# Patient Record
Sex: Female | Born: 1990 | Hispanic: Yes | Marital: Single | Smoking: Former smoker
Health system: Southern US, Community
[De-identification: ages and names within clinical notes are randomized; demographics above are authoritative.]

## PROBLEM LIST (undated history)

## (undated) ENCOUNTER — Inpatient Hospital Stay (HOSPITAL_COMMUNITY): Payer: Self-pay

## (undated) DIAGNOSIS — I82629 Acute embolism and thrombosis of deep veins of unspecified upper extremity: Secondary | ICD-10-CM

## (undated) DIAGNOSIS — O009 Unspecified ectopic pregnancy without intrauterine pregnancy: Secondary | ICD-10-CM

## (undated) DIAGNOSIS — N739 Female pelvic inflammatory disease, unspecified: Secondary | ICD-10-CM

## (undated) DIAGNOSIS — R011 Cardiac murmur, unspecified: Secondary | ICD-10-CM

## (undated) DIAGNOSIS — J45909 Unspecified asthma, uncomplicated: Secondary | ICD-10-CM

## (undated) HISTORY — PX: WISDOM TOOTH EXTRACTION: SHX21

---
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
## 2009-04-07 DIAGNOSIS — I82629 Acute embolism and thrombosis of deep veins of unspecified upper extremity: Secondary | ICD-10-CM

## 2009-04-07 HISTORY — DX: Acute embolism and thrombosis of deep veins of unspecified upper extremity: I82.629

## 2010-02-12 ENCOUNTER — Emergency Department (HOSPITAL_COMMUNITY): Admission: EM | Admit: 2010-02-12 | Discharge: 2010-02-12 | Payer: Self-pay | Admitting: Emergency Medicine

## 2010-02-12 ENCOUNTER — Encounter (INDEPENDENT_AMBULATORY_CARE_PROVIDER_SITE_OTHER): Payer: Self-pay | Admitting: Emergency Medicine

## 2010-06-18 LAB — URINE CULTURE: Culture  Setup Time: 201111082027

## 2010-06-18 LAB — DIFFERENTIAL
Basophils Relative: 0 % (ref 0–1)
Eosinophils Absolute: 0.3 10*3/uL (ref 0.0–0.7)
Eosinophils Relative: 2 % (ref 0–5)
Lymphs Abs: 3.3 10*3/uL (ref 0.7–4.0)
Monocytes Relative: 11 % (ref 3–12)

## 2010-06-18 LAB — CBC
Hemoglobin: 12.5 g/dL (ref 12.0–15.0)
MCH: 29.8 pg (ref 26.0–34.0)
MCHC: 33.8 g/dL (ref 30.0–36.0)
MCV: 88.1 fL (ref 78.0–100.0)
Platelets: 337 10*3/uL (ref 150–400)
RBC: 4.2 MIL/uL (ref 3.87–5.11)

## 2010-06-18 LAB — POCT I-STAT, CHEM 8
BUN: 4 mg/dL — ABNORMAL LOW (ref 6–23)
Calcium, Ion: 1.09 mmol/L — ABNORMAL LOW (ref 1.12–1.32)
Creatinine, Ser: 0.7 mg/dL (ref 0.4–1.2)
TCO2: 24 mmol/L (ref 0–100)

## 2010-06-18 LAB — URINALYSIS, ROUTINE W REFLEX MICROSCOPIC
Bilirubin Urine: NEGATIVE
Glucose, UA: NEGATIVE mg/dL
Hgb urine dipstick: NEGATIVE
Protein, ur: NEGATIVE mg/dL
Urobilinogen, UA: 0.2 mg/dL (ref 0.0–1.0)

## 2010-06-18 LAB — URINE MICROSCOPIC-ADD ON

## 2010-06-22 ENCOUNTER — Emergency Department (HOSPITAL_COMMUNITY)
Admission: EM | Admit: 2010-06-22 | Discharge: 2010-06-22 | Disposition: A | Discharge status: Home / Self Care | Payer: Medicaid Other | Attending: Emergency Medicine | Admitting: Emergency Medicine

## 2010-06-22 DIAGNOSIS — R064 Hyperventilation: Secondary | ICD-10-CM | POA: Insufficient documentation

## 2010-06-22 DIAGNOSIS — F319 Bipolar disorder, unspecified: Secondary | ICD-10-CM | POA: Insufficient documentation

## 2010-06-22 DIAGNOSIS — R55 Syncope and collapse: Secondary | ICD-10-CM | POA: Insufficient documentation

## 2010-06-22 DIAGNOSIS — F1010 Alcohol abuse, uncomplicated: Secondary | ICD-10-CM | POA: Insufficient documentation

## 2010-06-22 LAB — RAPID URINE DRUG SCREEN, HOSP PERFORMED
Barbiturates: NOT DETECTED
Opiates: POSITIVE — AB
Tetrahydrocannabinol: NOT DETECTED

## 2010-06-22 LAB — CBC
HCT: 37.3 % (ref 36.0–46.0)
MCHC: 34.6 g/dL (ref 30.0–36.0)
RDW: 13.4 % (ref 11.5–15.5)

## 2010-06-22 LAB — DIFFERENTIAL
Basophils Absolute: 0.1 10*3/uL (ref 0.0–0.1)
Basophils Relative: 1 % (ref 0–1)
Eosinophils Absolute: 0.2 10*3/uL (ref 0.0–0.7)
Eosinophils Relative: 2 % (ref 0–5)
Monocytes Absolute: 1 10*3/uL (ref 0.1–1.0)

## 2010-06-22 LAB — POCT I-STAT, CHEM 8
Calcium, Ion: 1.14 mmol/L (ref 1.12–1.32)
Creatinine, Ser: 1 mg/dL (ref 0.4–1.2)
Glucose, Bld: 99 mg/dL (ref 70–99)
Hemoglobin: 13.6 g/dL (ref 12.0–15.0)
TCO2: 23 mmol/L (ref 0–100)

## 2010-06-24 LAB — GLUCOSE, CAPILLARY

## 2010-07-03 ENCOUNTER — Emergency Department (HOSPITAL_COMMUNITY)
Admission: EM | Admit: 2010-07-03 | Discharge: 2010-07-03 | Disposition: A | Discharge status: Home / Self Care | Payer: Medicaid Other | Attending: Emergency Medicine | Admitting: Emergency Medicine

## 2010-07-03 DIAGNOSIS — K2970 Gastritis, unspecified, without bleeding: Secondary | ICD-10-CM | POA: Insufficient documentation

## 2010-07-03 DIAGNOSIS — K2990 Gastroduodenitis, unspecified, without bleeding: Secondary | ICD-10-CM | POA: Insufficient documentation

## 2010-07-03 DIAGNOSIS — R10816 Epigastric abdominal tenderness: Secondary | ICD-10-CM | POA: Insufficient documentation

## 2010-07-03 LAB — DIFFERENTIAL
Basophils Absolute: 0 10*3/uL (ref 0.0–0.1)
Basophils Relative: 0 % (ref 0–1)
Eosinophils Absolute: 0.2 10*3/uL (ref 0.0–0.7)
Eosinophils Relative: 2 % (ref 0–5)
Monocytes Absolute: 1 10*3/uL (ref 0.1–1.0)
Neutro Abs: 7.5 10*3/uL (ref 1.7–7.7)

## 2010-07-03 LAB — URINALYSIS, ROUTINE W REFLEX MICROSCOPIC
Bilirubin Urine: NEGATIVE
Nitrite: NEGATIVE
Specific Gravity, Urine: 1.023 (ref 1.005–1.030)
Urobilinogen, UA: 0.2 mg/dL (ref 0.0–1.0)

## 2010-07-03 LAB — COMPREHENSIVE METABOLIC PANEL
ALT: 11 U/L (ref 0–35)
AST: 13 U/L (ref 0–37)
Calcium: 8 mg/dL — ABNORMAL LOW (ref 8.4–10.5)
GFR calc Af Amer: >60 mL/min (ref >60)
Sodium: 136 mEq/L (ref 135–145)
Total Protein: 6.2 g/dL (ref 6.0–8.3)

## 2010-07-03 LAB — CBC
MCHC: 32.6 g/dL (ref 30.0–36.0)
Platelets: 304 10*3/uL (ref 150–400)
RDW: 13.4 % (ref 11.5–15.5)

## 2010-07-03 LAB — PREGNANCY, URINE: Preg Test, Ur: NEGATIVE

## 2010-07-12 LAB — CBC
HCT: 36.3 % (ref 36.0–46.0)
HCT: 37.6 % (ref 36.0–46.0)
Hemoglobin: 12.2 g/dL (ref 12.0–15.0)
Hemoglobin: 12.9 g/dL (ref 12.0–15.0)
MCHC: 34.4 g/dL (ref 30.0–36.0)
MCV: 89.1 fL (ref 78.0–100.0)
Platelets: 287 10*3/uL (ref 150–400)
RDW: 12.3 % (ref 11.5–15.5)
RDW: 12.6 % (ref 11.5–15.5)
WBC: 10 10*3/uL (ref 4.0–10.5)

## 2010-07-12 LAB — URINALYSIS, ROUTINE W REFLEX MICROSCOPIC
Bilirubin Urine: NEGATIVE
Glucose, UA: NEGATIVE mg/dL
Glucose, UA: NEGATIVE mg/dL
Hgb urine dipstick: NEGATIVE
Ketones, ur: 15 mg/dL — AB
Ketones, ur: NEGATIVE mg/dL
Leukocytes, UA: NEGATIVE
Protein, ur: NEGATIVE mg/dL
Protein, ur: NEGATIVE mg/dL
pH: 7 (ref 5.0–8.0)

## 2010-07-12 LAB — DIFFERENTIAL
Basophils Absolute: 0 10*3/uL (ref 0.0–0.1)
Basophils Absolute: 0 10*3/uL (ref 0.0–0.1)
Basophils Relative: 0 % (ref 0–1)
Basophils Relative: 1 % (ref 0–1)
Eosinophils Absolute: 0.1 10*3/uL (ref 0.0–0.7)
Eosinophils Relative: 2 % (ref 0–5)
Monocytes Absolute: 0.7 10*3/uL (ref 0.1–1.0)
Monocytes Relative: 7 % (ref 3–12)
Monocytes Relative: 10 % (ref 3–12)
Neutro Abs: 4.3 10*3/uL (ref 1.7–7.7)
Neutrophils Relative %: 50 % (ref 43–77)

## 2010-07-12 LAB — COMPREHENSIVE METABOLIC PANEL
ALT: 21 U/L (ref 0–35)
Albumin: 3.8 g/dL (ref 3.5–5.2)
Alkaline Phosphatase: 51 U/L (ref 39–117)
Alkaline Phosphatase: 47 U/L (ref 39–117)
BUN: 4 mg/dL — ABNORMAL LOW (ref 6–23)
Chloride: 106 mEq/L (ref 96–112)
Creatinine, Ser: 0.61 mg/dL (ref 0.4–1.2)
Glucose, Bld: 84 mg/dL (ref 70–99)
Potassium: 4.6 mEq/L (ref 3.5–5.1)
Potassium: 4.3 mEq/L (ref 3.5–5.1)
Sodium: 139 mEq/L (ref 135–145)
Total Bilirubin: 0.6 mg/dL (ref 0.3–1.2)
Total Bilirubin: 0.3 mg/dL (ref 0.3–1.2)
Total Protein: 6.6 g/dL (ref 6.0–8.3)
Total Protein: 6.9 g/dL (ref 6.0–8.3)

## 2010-07-12 LAB — URINE MICROSCOPIC-ADD ON

## 2010-07-12 LAB — PREGNANCY, URINE: Preg Test, Ur: NEGATIVE

## 2010-07-12 LAB — POCT PREGNANCY, URINE: Preg Test, Ur: NEGATIVE

## 2010-07-18 ENCOUNTER — Inpatient Hospital Stay (HOSPITAL_COMMUNITY)
Admission: AD | Admit: 2010-07-18 | Discharge: 2010-07-22 | DRG: 885 | Disposition: A | Discharge status: Home / Self Care | Payer: Medicaid Other | Source: Ambulatory Visit | Attending: Psychiatry | Admitting: Psychiatry

## 2010-07-18 ENCOUNTER — Emergency Department (HOSPITAL_COMMUNITY)
Admission: EM | Admit: 2010-07-18 | Discharge: 2010-07-18 | Disposition: A | Discharge status: Other Acute Inpatient Hospital | Payer: Medicaid Other | Attending: Emergency Medicine | Admitting: Emergency Medicine

## 2010-07-18 DIAGNOSIS — IMO0002 Reserved for concepts with insufficient information to code with codable children: Secondary | ICD-10-CM | POA: Insufficient documentation

## 2010-07-18 DIAGNOSIS — F39 Unspecified mood [affective] disorder: Principal | ICD-10-CM

## 2010-07-18 DIAGNOSIS — R45851 Suicidal ideations: Secondary | ICD-10-CM | POA: Insufficient documentation

## 2010-07-18 DIAGNOSIS — F29 Unspecified psychosis not due to a substance or known physiological condition: Secondary | ICD-10-CM

## 2010-07-18 DIAGNOSIS — F1910 Other psychoactive substance abuse, uncomplicated: Secondary | ICD-10-CM

## 2010-07-18 DIAGNOSIS — R443 Hallucinations, unspecified: Secondary | ICD-10-CM | POA: Insufficient documentation

## 2010-07-18 DIAGNOSIS — Z560 Unemployment, unspecified: Secondary | ICD-10-CM

## 2010-07-18 DIAGNOSIS — X789XXA Intentional self-harm by unspecified sharp object, initial encounter: Secondary | ICD-10-CM | POA: Insufficient documentation

## 2010-07-18 DIAGNOSIS — S61509A Unspecified open wound of unspecified wrist, initial encounter: Secondary | ICD-10-CM

## 2010-07-18 DIAGNOSIS — X838XXA Intentional self-harm by other specified means, initial encounter: Secondary | ICD-10-CM

## 2010-07-18 LAB — URINALYSIS, ROUTINE W REFLEX MICROSCOPIC
Glucose, UA: NEGATIVE mg/dL
Hgb urine dipstick: NEGATIVE
Ketones, ur: NEGATIVE mg/dL
Protein, ur: NEGATIVE mg/dL

## 2010-07-18 LAB — ETHANOL: Alcohol, Ethyl (B): 81 mg/dL — ABNORMAL HIGH (ref 0–10)

## 2010-07-18 LAB — CBC
Hemoglobin: 13.4 g/dL (ref 12.0–15.0)
RBC: 4.67 MIL/uL (ref 3.87–5.11)

## 2010-07-18 LAB — COMPREHENSIVE METABOLIC PANEL
AST: 12 U/L (ref 0–37)
Albumin: 3.8 g/dL (ref 3.5–5.2)
Alkaline Phosphatase: 60 U/L (ref 39–117)
Chloride: 108 mEq/L (ref 96–112)
GFR calc Af Amer: >60 mL/min (ref >60)
Potassium: 3.9 mEq/L (ref 3.5–5.1)
Total Bilirubin: 0.3 mg/dL (ref 0.3–1.2)

## 2010-07-18 LAB — DIFFERENTIAL
Basophils Absolute: 0.1 10*3/uL (ref 0.0–0.1)
Basophils Relative: 0 % (ref 0–1)
Neutro Abs: 8.4 10*3/uL — ABNORMAL HIGH (ref 1.7–7.7)
Neutrophils Relative %: 65 % (ref 43–77)

## 2010-07-18 LAB — RAPID URINE DRUG SCREEN, HOSP PERFORMED
Amphetamines: NOT DETECTED
Barbiturates: NOT DETECTED
Benzodiazepines: NOT DETECTED
Cocaine: NOT DETECTED

## 2010-07-19 DIAGNOSIS — F29 Unspecified psychosis not due to a substance or known physiological condition: Secondary | ICD-10-CM
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
## 2010-07-27 NOTE — Discharge Summary (Signed)
NAMELATRISHA, [name]                 ACCOUNT NO.:  [id number]  MEDICAL RECORD NO.:  [id number]           PATIENT TYPE:  I  LOCATION:                                FACILITY:  [facility]  PHYSICIAN:  Eulogio Ditch, MD DATE OF BIRTH:  1990/10/21  DATE OF ADMISSION:  07/18/2010 DATE OF DISCHARGE:  07/22/2010                              DISCHARGE SUMMARY   HISTORY OF PRESENT ILLNESS:  This is a 20 year old female who was voluntarily admitted on July 18, 2010.  REASON FOR ADMISSION:  The patient presented with a history of auditory hallucinations and had a self-inflicted injury.  She states that she does this when she hears voices.  She also was experiencing visual hallucinations, shadows.  She again had a self-inflicted injury.  She denied any suicidal or homicidal thoughts.  FINAL DIAGNOSES:  AXIS I: 1. Mood disorder. 2. Polysubstance abuse. AXIS II: Deferred. AXIS III:  Status post self-inflicted injury to left wrist. AXIS IV: Psychosocial problems. AXIS V: 50 to 55.  SIGNIFICANT LABS:  Urinalysis was negative.  Urine pregnancy test was negative.  Urine drug screen was positive for cannabis.  Alcohol level at 81.  BUN was 4 with a white count of 13.  SIGNIFICANT FINDINGS:  This is a fully alert and cooperative female, very quiet speaking in volume.  Her speech was clear.  She was dressed in scrubs, somewhat disheveled but fair eye contact.  She was depressed but offered a lot of information to her symptoms and thought processes. She was endorsing auditory and visual hallucinations but did not appear to be actively responding.  Her memory appeared intact.  Her judgment and insight were fair.  The patient was admitted to the adult milieu. We initiated Risperdal for psychotic symptoms and to help stabilize her mood.  We addressed her substance use and watched her injuries for signs and symptoms of infections.  The patient did well on her Risperdal.  Her auditory hallucinations  were resolving.  She felt her medications were helpful.  She was sleeping well.  She was attending groups.  We contacted the patient's mother to gather collateral information, address safety concerns, and provide information.  On the day of discharge the patient was fully alert and she denied any auditory or visual hallucinations.  Denied any depressive symptoms.  Showed no signs of hypomania or mania.  She denied any suicidal or homicidal thoughts.  She had specific discharge plans with appropriate followup.  She was sleeping well, denied any side effects to her medication.  She felt they were of benefit, and had a good plan in case she experienced any suicidal or psychotic symptoms and we felt the patient was stable for discharge.  DISCHARGE MEDICATIONS: 1. Risperdal 0.25 mg 1 tablet twice a day and at bedtime. 2. Trazodone 100 mg at bedtime as needed for sleep.  FOLLOWUP:  Her follow-up appointment was at [facility] on Tuesday, April 24th at 3:20, at phone number [phone].      Landry Corporal, N.P.   ______________________________ Eulogio Ditch, MD    JO/MEDQ  D:  07/23/2010  T:  07/23/2010  Job:  [id number]  Electronically Signed by Limmie PatriciaP. on 07/24/2010 11:44:02 AM Electronically Signed by Eulogio Ditch  on 07/27/2010 05:38:58 AM
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
## 2010-09-01 ENCOUNTER — Emergency Department (HOSPITAL_COMMUNITY)
Admission: EM | Admit: 2010-09-01 | Discharge: 2010-09-01 | Disposition: A | Discharge status: Home / Self Care | Payer: Medicaid Other | Attending: Emergency Medicine | Admitting: Emergency Medicine

## 2010-09-01 DIAGNOSIS — L509 Urticaria, unspecified: Secondary | ICD-10-CM | POA: Insufficient documentation

## 2010-09-01 DIAGNOSIS — L2989 Other pruritus: Secondary | ICD-10-CM | POA: Insufficient documentation

## 2010-09-01 DIAGNOSIS — L298 Other pruritus: Secondary | ICD-10-CM | POA: Insufficient documentation

## 2010-09-12 ENCOUNTER — Emergency Department (HOSPITAL_COMMUNITY)
Admission: EM | Admit: 2010-09-12 | Discharge: 2010-09-12 | Disposition: A | Discharge status: Home / Self Care | Payer: Medicaid Other | Attending: Emergency Medicine | Admitting: Emergency Medicine

## 2010-09-12 DIAGNOSIS — L298 Other pruritus: Secondary | ICD-10-CM | POA: Insufficient documentation

## 2010-09-12 DIAGNOSIS — L2989 Other pruritus: Secondary | ICD-10-CM | POA: Insufficient documentation

## 2010-09-12 DIAGNOSIS — R21 Rash and other nonspecific skin eruption: Secondary | ICD-10-CM | POA: Insufficient documentation

## 2010-10-18 ENCOUNTER — Emergency Department (HOSPITAL_COMMUNITY)
Admission: EM | Admit: 2010-10-18 | Discharge: 2010-10-18 | Disposition: A | Discharge status: Home / Self Care | Payer: Medicaid Other | Attending: Emergency Medicine | Admitting: Emergency Medicine

## 2010-10-18 DIAGNOSIS — F3289 Other specified depressive episodes: Secondary | ICD-10-CM | POA: Insufficient documentation

## 2010-10-18 DIAGNOSIS — Z23 Encounter for immunization: Secondary | ICD-10-CM | POA: Insufficient documentation

## 2010-10-18 DIAGNOSIS — R21 Rash and other nonspecific skin eruption: Secondary | ICD-10-CM | POA: Insufficient documentation

## 2010-10-18 DIAGNOSIS — S61509A Unspecified open wound of unspecified wrist, initial encounter: Secondary | ICD-10-CM | POA: Insufficient documentation

## 2010-10-18 DIAGNOSIS — X789XXA Intentional self-harm by unspecified sharp object, initial encounter: Secondary | ICD-10-CM | POA: Insufficient documentation

## 2010-10-18 DIAGNOSIS — F329 Major depressive disorder, single episode, unspecified: Secondary | ICD-10-CM | POA: Insufficient documentation

## 2010-10-18 LAB — COMPREHENSIVE METABOLIC PANEL
AST: 13 U/L (ref 0–37)
Albumin: 3.8 g/dL (ref 3.5–5.2)
Calcium: 9.5 mg/dL (ref 8.4–10.5)
Creatinine, Ser: 0.66 mg/dL (ref 0.50–1.10)
Sodium: 140 mEq/L (ref 135–145)
Total Protein: 7.5 g/dL (ref 6.0–8.3)

## 2010-10-18 LAB — URINALYSIS, ROUTINE W REFLEX MICROSCOPIC
Ketones, ur: NEGATIVE mg/dL
Nitrite: NEGATIVE
Protein, ur: NEGATIVE mg/dL
Urobilinogen, UA: 0.2 mg/dL (ref 0.0–1.0)

## 2010-10-18 LAB — CBC
MCH: 29.7 pg (ref 26.0–34.0)
MCHC: 34.5 g/dL (ref 30.0–36.0)
Platelets: 331 10*3/uL (ref 150–400)
RBC: 4.38 MIL/uL (ref 3.87–5.11)
RDW: 13 % (ref 11.5–15.5)

## 2010-10-18 LAB — DIFFERENTIAL
Basophils Relative: 0 % (ref 0–1)
Eosinophils Absolute: 0.1 10*3/uL (ref 0.0–0.7)
Eosinophils Relative: 1 % (ref 0–5)
Monocytes Absolute: 0.6 10*3/uL (ref 0.1–1.0)
Monocytes Relative: 7 % (ref 3–12)
Neutrophils Relative %: 58 % (ref 43–77)

## 2010-10-18 LAB — RAPID URINE DRUG SCREEN, HOSP PERFORMED
Barbiturates: NOT DETECTED
Benzodiazepines: NOT DETECTED
Cocaine: NOT DETECTED
Tetrahydrocannabinol: POSITIVE — AB

## 2012-06-08 ENCOUNTER — Inpatient Hospital Stay (HOSPITAL_COMMUNITY)
Admission: AD | Admit: 2012-06-08 | Discharge: 2012-06-08 | Disposition: A | Discharge status: Home / Self Care | Payer: Self-pay | Source: Ambulatory Visit | Attending: Obstetrics & Gynecology | Admitting: Obstetrics & Gynecology

## 2012-06-08 ENCOUNTER — Inpatient Hospital Stay (HOSPITAL_COMMUNITY): Payer: Self-pay

## 2012-06-08 ENCOUNTER — Encounter (HOSPITAL_COMMUNITY): Payer: Self-pay | Admitting: Medical

## 2012-06-08 DIAGNOSIS — O99891 Other specified diseases and conditions complicating pregnancy: Secondary | ICD-10-CM

## 2012-06-08 DIAGNOSIS — B9689 Other specified bacterial agents as the cause of diseases classified elsewhere: Secondary | ICD-10-CM | POA: Insufficient documentation

## 2012-06-08 DIAGNOSIS — O2390 Unspecified genitourinary tract infection in pregnancy, unspecified trimester: Secondary | ICD-10-CM

## 2012-06-08 DIAGNOSIS — O26899 Other specified pregnancy related conditions, unspecified trimester: Secondary | ICD-10-CM

## 2012-06-08 DIAGNOSIS — N760 Acute vaginitis: Secondary | ICD-10-CM | POA: Insufficient documentation

## 2012-06-08 DIAGNOSIS — A499 Bacterial infection, unspecified: Secondary | ICD-10-CM | POA: Insufficient documentation

## 2012-06-08 DIAGNOSIS — R109 Unspecified abdominal pain: Secondary | ICD-10-CM

## 2012-06-08 DIAGNOSIS — K219 Gastro-esophageal reflux disease without esophagitis: Secondary | ICD-10-CM

## 2012-06-08 DIAGNOSIS — O210 Mild hyperemesis gravidarum: Secondary | ICD-10-CM

## 2012-06-08 HISTORY — DX: Unspecified ectopic pregnancy without intrauterine pregnancy: O00.90

## 2012-06-08 HISTORY — DX: Female pelvic inflammatory disease, unspecified: N73.9

## 2012-06-08 HISTORY — DX: Unspecified asthma, uncomplicated: J45.909

## 2012-06-08 LAB — URINALYSIS, ROUTINE W REFLEX MICROSCOPIC
Ketones, ur: NEGATIVE mg/dL
Leukocytes, UA: NEGATIVE
Nitrite: NEGATIVE
Specific Gravity, Urine: 1.01 (ref 1.005–1.030)
pH: 7.5 (ref 5.0–8.0)

## 2012-06-08 LAB — WET PREP, GENITAL

## 2012-06-08 LAB — ABO/RH: ABO/RH(D): B POS

## 2012-06-08 LAB — POCT PREGNANCY, URINE: Preg Test, Ur: POSITIVE — AB

## 2012-06-08 LAB — CBC
Hemoglobin: 13.2 g/dL (ref 12.0–15.0)
MCHC: 33.6 g/dL (ref 30.0–36.0)
RBC: 4.44 MIL/uL (ref 3.87–5.11)
WBC: 12.4 10*3/uL — ABNORMAL HIGH (ref 4.0–10.5)

## 2012-06-08 LAB — HCG, QUANTITATIVE, PREGNANCY: hCG, Beta Chain, Quant, S: 3950 m[IU]/mL — ABNORMAL HIGH (ref <5)

## 2012-06-08 MED ORDER — PRENATAL COMPLETE 14-0.4 MG PO TABS: 1.0000 | ORAL_TABLET | Freq: Every day | ORAL | Status: DC

## 2012-06-08 MED ORDER — ONDANSETRON 8 MG PO TBDP
8.0000 mg | ORAL_TABLET | Freq: Once | ORAL | Status: AC
  Administered 2012-06-08: 8 mg via ORAL
  Filled 2012-06-08: qty 1

## 2012-06-08 MED ORDER — LANSOPRAZOLE 15 MG PO CPDR: 15.0000 mg | DELAYED_RELEASE_CAPSULE | Freq: Every day | ORAL | Status: DC

## 2012-06-08 MED ORDER — METRONIDAZOLE 500 MG PO TABS: 500.0000 mg | ORAL_TABLET | Freq: Two times a day (BID) | ORAL | Status: DC

## 2012-06-08 MED ORDER — PROMETHAZINE HCL 25 MG PO TABS: 25.0000 mg | ORAL_TABLET | Freq: Four times a day (QID) | ORAL | Status: DC | PRN

## 2012-06-08 NOTE — MAU Note (Signed)
Ongoing vomiting and pain in breasts for past week.  Had several + HPT, wants to make sure nothing is wrong.
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
## 2012-06-08 NOTE — MAU Note (Signed)
Name and DOB verified, pt confirmed spelling of name is correct on ID band.

## 2012-06-18 ENCOUNTER — Ambulatory Visit (HOSPITAL_COMMUNITY)
Admission: RE | Admit: 2012-06-18 | Discharge: 2012-06-18 | Disposition: A | Discharge status: Home / Self Care | Payer: Self-pay | Source: Ambulatory Visit | Attending: Medical | Admitting: Medical

## 2012-06-18 ENCOUNTER — Inpatient Hospital Stay (HOSPITAL_COMMUNITY)
Admission: AD | Admit: 2012-06-18 | Discharge: 2012-06-18 | Disposition: A | Discharge status: Home / Self Care | Payer: Self-pay | Source: Ambulatory Visit | Attending: Obstetrics & Gynecology | Admitting: Obstetrics & Gynecology

## 2012-06-18 DIAGNOSIS — O99891 Other specified diseases and conditions complicating pregnancy: Secondary | ICD-10-CM | POA: Insufficient documentation

## 2012-06-18 DIAGNOSIS — O3680X0 Pregnancy with inconclusive fetal viability, not applicable or unspecified: Secondary | ICD-10-CM | POA: Insufficient documentation

## 2012-06-18 DIAGNOSIS — Z3689 Encounter for other specified antenatal screening: Secondary | ICD-10-CM | POA: Insufficient documentation

## 2012-06-18 NOTE — MAU Provider Note (Signed)
Attestation of Attending Supervision of Advanced Practitioner (CNM/NP): Evaluation and management procedures were performed by the Advanced Practitioner under my supervision and collaboration.  I have reviewed the Advanced Practitioner's note and chart, and I agree with the management and plan.  HARRAWAY-SMITH, CAROLYN 9:43 PM     

## 2012-06-18 NOTE — MAU Provider Note (Signed)
  History     CSN: [id number]  Arrival date and time: 06/18/12 1342   None     No chief complaint on file.  HPI Patient returns for followup ultrasound for viability.  She was seen on 3/4 with nausea, vomiting in early pregnancy.  BHCG on that date 3950 and U/S showed IUGS [redacted]w[redacted]d with YS.   She needs verification letter.  She denies pain or bleeding today     Past Medical History  Diagnosis Date  . Asthma   . Pelvic inflammatory disease (PID)   . Ectopic pregnancy     Past Surgical History  Procedure Laterality Date  . Wisdom tooth extraction      No family history on file.  History  Substance Use Topics  . Smoking status: Current Every Day Smoker -- 0.25 packs/day    Types: Cigarettes  . Smokeless tobacco: Not on file  . Alcohol Use: No    Allergies: No Known Allergies  Prescriptions prior to admission  Medication Sig Dispense Refill  . lansoprazole (PREVACID) 15 MG capsule Take 1 capsule (15 mg total) by mouth daily.  30 capsule  2  . metroNIDAZOLE (FLAGYL) 500 MG tablet Take 1 tablet (500 mg total) by mouth 2 (two) times daily.  14 tablet  0  . Prenatal Vit-Fe Fumarate-FA (PRENATAL COMPLETE) 14-0.4 MG TABS Take 1 tablet by mouth daily.  60 each  2  . promethazine (PHENERGAN) 25 MG tablet Take 1 tablet (25 mg total) by mouth every 6 (six) hours as needed for nausea.  30 tablet  0    ROS Physical Exam   Last menstrual period 05/05/2012.  Physical Exam  Constitutional: She is oriented to person, place, and time. She appears well-developed and well-nourished. No distress.  Genitourinary:  Not indicated  Neurological: She is alert and oriented to person, place, and time.  Psychiatric: She has a normal mood and affect. Her behavior is normal.        MAU Course  Procedures  MDM  Assessment and Plan  A:  Follow up ultrasound for viability      IUP [redacted]w[redacted]d with + cardiac activity  P:  Verification letter given     To begin prenatal care with doctor of her  choice  KEY,EVE M 06/18/2012, 1:51 PM

## 2012-06-18 NOTE — MAU Note (Signed)
Patient to MAU after ultrasound to confirm viability. Patient denies pain or bleeding.

## 2012-07-15 ENCOUNTER — Encounter (HOSPITAL_COMMUNITY): Payer: Self-pay

## 2012-07-15 ENCOUNTER — Inpatient Hospital Stay (HOSPITAL_COMMUNITY): Payer: Medicaid Other

## 2012-07-15 ENCOUNTER — Inpatient Hospital Stay (HOSPITAL_COMMUNITY)
Admission: AD | Admit: 2012-07-15 | Discharge: 2012-07-16 | Disposition: A | Discharge status: Home / Self Care | Payer: Medicaid Other | Source: Ambulatory Visit | Attending: Obstetrics & Gynecology | Admitting: Obstetrics & Gynecology

## 2012-07-15 DIAGNOSIS — Z86718 Personal history of other venous thrombosis and embolism: Secondary | ICD-10-CM

## 2012-07-15 DIAGNOSIS — R109 Unspecified abdominal pain: Secondary | ICD-10-CM | POA: Insufficient documentation

## 2012-07-15 DIAGNOSIS — O26899 Other specified pregnancy related conditions, unspecified trimester: Secondary | ICD-10-CM

## 2012-07-15 DIAGNOSIS — R51 Headache: Secondary | ICD-10-CM | POA: Insufficient documentation

## 2012-07-15 DIAGNOSIS — O99891 Other specified diseases and conditions complicating pregnancy: Secondary | ICD-10-CM | POA: Insufficient documentation

## 2012-07-15 HISTORY — DX: Acute embolism and thrombosis of deep veins of unspecified upper extremity: I82.629

## 2012-07-15 HISTORY — DX: Cardiac murmur, unspecified: R01.1

## 2012-07-15 LAB — URINALYSIS, ROUTINE W REFLEX MICROSCOPIC
Glucose, UA: NEGATIVE mg/dL
Ketones, ur: NEGATIVE mg/dL
Leukocytes, UA: NEGATIVE
pH: 7.5 (ref 5.0–8.0)

## 2012-07-15 MED ORDER — OXYCODONE-ACETAMINOPHEN 5-325 MG PO TABS
2.0000 | ORAL_TABLET | Freq: Once | ORAL | Status: AC
  Administered 2012-07-15: 2 via ORAL
  Filled 2012-07-15: qty 2

## 2012-07-15 NOTE — MAU Note (Signed)
Upper & lower abdominal pain that is stabbing & frontal headache x2 days. Denies vaginal bleeding. Some clear vaginal discharge. Has not started prenatal care yet.
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
## 2012-07-16 ENCOUNTER — Encounter (HOSPITAL_COMMUNITY): Payer: Self-pay | Admitting: Advanced Practice Midwife

## 2012-07-16 DIAGNOSIS — R109 Unspecified abdominal pain: Secondary | ICD-10-CM

## 2012-07-16 DIAGNOSIS — O26899 Other specified pregnancy related conditions, unspecified trimester: Secondary | ICD-10-CM

## 2012-07-16 LAB — CBC
HCT: 33.5 % — ABNORMAL LOW (ref 36.0–46.0)
Hemoglobin: 11.6 g/dL — ABNORMAL LOW (ref 12.0–15.0)
WBC: 13.7 10*3/uL — ABNORMAL HIGH (ref 4.0–10.5)

## 2012-07-16 LAB — COMPREHENSIVE METABOLIC PANEL
AST: 13 U/L (ref 0–37)
Albumin: 3.4 g/dL — ABNORMAL LOW (ref 3.5–5.2)
BUN: 7 mg/dL (ref 6–23)
Calcium: 9.2 mg/dL (ref 8.4–10.5)
Creatinine, Ser: 0.47 mg/dL — ABNORMAL LOW (ref 0.50–1.10)
Total Bilirubin: 0.2 mg/dL — ABNORMAL LOW (ref 0.3–1.2)
Total Protein: 6.6 g/dL (ref 6.0–8.3)

## 2012-08-10 ENCOUNTER — Other Ambulatory Visit: Payer: Self-pay

## 2012-08-10 DIAGNOSIS — Z3201 Encounter for pregnancy test, result positive: Secondary | ICD-10-CM

## 2012-08-10 LAB — HIV ANTIBODY (ROUTINE TESTING W REFLEX): HIV: NONREACTIVE

## 2012-08-11 LAB — OBSTETRIC PANEL
Eosinophils Relative: 2 % (ref 0–5)
Hemoglobin: 11.4 g/dL — ABNORMAL LOW (ref 12.0–15.0)
Hepatitis B Surface Ag: NEGATIVE
Lymphocytes Relative: 30 % (ref 12–46)
Lymphs Abs: 2.9 10*3/uL (ref 0.7–4.0)
MCH: 30 pg (ref 26.0–34.0)
MCV: 88.7 fL (ref 78.0–100.0)
Monocytes Relative: 9 % (ref 3–12)
Platelets: 273 10*3/uL (ref 150–400)
RBC: 3.8 MIL/uL — ABNORMAL LOW (ref 3.87–5.11)
Rh Type: POSITIVE
Rubella: 0.27 Index (ref <0.90)
WBC: 9.8 10*3/uL (ref 4.0–10.5)

## 2012-08-12 LAB — HEMOGLOBINOPATHY EVALUATION
Hemoglobin Other: 0 %
Hgb S Quant: 0 %

## 2012-08-23 ENCOUNTER — Ambulatory Visit (INDEPENDENT_AMBULATORY_CARE_PROVIDER_SITE_OTHER): Payer: Self-pay | Admitting: Family Medicine

## 2012-08-23 ENCOUNTER — Encounter: Payer: Self-pay | Admitting: Family Medicine

## 2012-08-23 VITALS — BP 109/68 | Temp 98.4°F | Wt 165.0 lb

## 2012-08-23 DIAGNOSIS — R87612 Low grade squamous intraepithelial lesion on cytologic smear of cervix (LGSIL): Secondary | ICD-10-CM | POA: Insufficient documentation

## 2012-08-23 DIAGNOSIS — O2390 Unspecified genitourinary tract infection in pregnancy, unspecified trimester: Secondary | ICD-10-CM

## 2012-08-23 DIAGNOSIS — Z3400 Encounter for supervision of normal first pregnancy, unspecified trimester: Secondary | ICD-10-CM | POA: Insufficient documentation

## 2012-08-23 DIAGNOSIS — J45909 Unspecified asthma, uncomplicated: Secondary | ICD-10-CM

## 2012-08-23 DIAGNOSIS — I82629 Acute embolism and thrombosis of deep veins of unspecified upper extremity: Secondary | ICD-10-CM | POA: Insufficient documentation

## 2012-08-23 DIAGNOSIS — IMO0002 Reserved for concepts with insufficient information to code with codable children: Secondary | ICD-10-CM | POA: Insufficient documentation

## 2012-08-23 DIAGNOSIS — I82621 Acute embolism and thrombosis of deep veins of right upper extremity: Secondary | ICD-10-CM

## 2012-08-23 DIAGNOSIS — Z3402 Encounter for supervision of normal first pregnancy, second trimester: Secondary | ICD-10-CM

## 2012-08-23 LAB — POCT URINALYSIS DIP (DEVICE)
Bilirubin Urine: NEGATIVE
Ketones, ur: NEGATIVE mg/dL
Specific Gravity, Urine: 1.015 (ref 1.005–1.030)

## 2012-08-23 LAB — OB RESULTS CONSOLE GBS: GBS: POSITIVE

## 2012-08-23 MED ORDER — ALBUTEROL SULFATE HFA 108 (90 BASE) MCG/ACT IN AERS: 2.0000 | INHALATION_SPRAY | Freq: Four times a day (QID) | RESPIRATORY_TRACT | Status: DC | PRN
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
## 2012-08-23 NOTE — Progress Notes (Signed)
U/S scheduled September 13, 2012 at 930 am.

## 2012-08-23 NOTE — Progress Notes (Signed)
Nutrition note: 1st visit consult Pt has gained 14# @ [redacted]w[redacted]d, which is > expected. Pt reports eating 2 meals & 1 snack/d. Pt is a vegetarian but eats dairy. Pt is taking PNV. Pt reports no N/V but some heartburn with spicy foods. NKFA Pt received verbal & written education on general nutrition during pregnancy. Discussed tips to decrease heartburn. Encouraged a protein source with all meals & snacks. Discussed wt gain goals of 15-25# or 0.6#/wk. Pt agrees to continue taking PNV. Pt has WIC and plans to BF. F/u if referred Blondell Reveal, MS, RD, LDN

## 2012-08-23 NOTE — Progress Notes (Signed)
Pulse 86 Edema trace in feet.

## 2012-08-23 NOTE — Patient Instructions (Signed)
Pregnancy - Second Trimester The second trimester of pregnancy (3 to 6 months) is a period of rapid growth for you and your baby. At the end of the sixth month, your baby is about 9 inches long and weighs 1 1/2 pounds. You will begin to feel the baby move between 18 and 20 weeks of the pregnancy. This is called quickening. Weight gain is faster. A clear fluid (colostrum) may leak out of your breasts. You may feel small contractions of the womb (uterus). This is known as false labor or Braxton-Hicks contractions. This is like a practice for labor when the baby is ready to be born. Usually, the problems with morning sickness have usually passed by the end of your first trimester. Some women develop small dark blotches (called cholasma, mask of pregnancy) on their face that usually goes away after the baby is born. Exposure to the sun makes the blotches worse. Acne may also develop in some pregnant women and pregnant women who have acne, may find that it goes away. PRENATAL EXAMS  Blood work may continue to be done during prenatal exams. These tests are done to check on your health and the probable health of your baby. Blood work is used to follow your blood levels (hemoglobin). Anemia (low hemoglobin) is common during pregnancy. Iron and vitamins are given to help prevent this. You will also be checked for diabetes between 24 and 28 weeks of the pregnancy. Some of the previous blood tests may be repeated.  The size of the uterus is measured during each visit. This is to make sure that the baby is continuing to grow properly according to the dates of the pregnancy.  Your blood pressure is checked every prenatal visit. This is to make sure you are not getting toxemia.  Your urine is checked to make sure you do not have an infection, diabetes or protein in the urine.  Your weight is checked often to make sure gains are happening at the suggested rate. This is to ensure that both you and your baby are  growing normally.  Sometimes, an ultrasound is performed to confirm the proper growth and development of the baby. This is a test which bounces harmless sound waves off the baby so your caregiver can more accurately determine due dates. Sometimes, a specialized test is done on the amniotic fluid surrounding the baby. This test is called an amniocentesis. The amniotic fluid is obtained by sticking a needle into the belly (abdomen). This is done to check the chromosomes in instances where there is a concern about possible genetic problems with the baby. It is also sometimes done near the end of pregnancy if an early delivery is required. In this case, it is done to help make sure the baby's lungs are mature enough for the baby to live outside of the womb. CHANGES OCCURING IN THE SECOND TRIMESTER OF PREGNANCY Your body goes through many changes during pregnancy. They vary from person to person. Talk to your caregiver about changes you notice that you are concerned about.  During the second trimester, you will likely have an increase in your appetite. It is normal to have cravings for certain foods. This varies from person to person and pregnancy to pregnancy.  Your lower abdomen will begin to bulge.  You may have to urinate more often because the uterus and baby are pressing on your bladder. It is also common to get more bladder infections during pregnancy (pain with urination). You can help this by   drinking lots of fluids and emptying your bladder before and after intercourse.  You may begin to get stretch marks on your hips, abdomen, and breasts. These are normal changes in the body during pregnancy. There are no exercises or medications to take that prevent this change.  You may begin to develop swollen and bulging veins (varicose veins) in your legs. Wearing support hose, elevating your feet for 15 minutes, 3 to 4 times a day and limiting salt in your diet helps lessen the problem.  Heartburn may  develop as the uterus grows and pushes up against the stomach. Antacids recommended by your caregiver helps with this problem. Also, eating smaller meals 4 to 5 times a day helps.  Constipation can be treated with a stool softener or adding bulk to your diet. Drinking lots of fluids, vegetables, fruits, and whole grains are helpful.  Exercising is also helpful. If you have been very active up until your pregnancy, most of these activities can be continued during your pregnancy. If you have been less active, it is helpful to start an exercise program such as walking.  Hemorrhoids (varicose veins in the rectum) may develop at the end of the second trimester. Warm sitz baths and hemorrhoid cream recommended by your caregiver helps hemorrhoid problems.  Backaches may develop during this time of your pregnancy. Avoid heavy lifting, wear low heal shoes and practice good posture to help with backache problems.  Some pregnant women develop tingling and numbness of their hand and fingers because of swelling and tightening of ligaments in the wrist (carpel tunnel syndrome). This goes away after the baby is born.  As your breasts enlarge, you may have to get a bigger bra. Get a comfortable, cotton, support bra. Do not get a nursing bra until the last month of the pregnancy if you will be nursing the baby.  You may get a dark line from your belly button to the pubic area called the linea nigra.  You may develop rosy cheeks because of increase blood flow to the face.  You may develop spider looking lines of the face, neck, arms and chest. These go away after the baby is born. HOME CARE INSTRUCTIONS   It is extremely important to avoid all smoking, herbs, alcohol, and unprescribed drugs during your pregnancy. These chemicals affect the formation and growth of the baby. Avoid these chemicals throughout the pregnancy to ensure the delivery of a healthy infant.  Most of your home care instructions are the same  as suggested for the first trimester of your pregnancy. Keep your caregiver's appointments. Follow your caregiver's instructions regarding medication use, exercise and diet.  During pregnancy, you are providing food for you and your baby. Continue to eat regular, well-balanced meals. Choose foods such as meat, fish, milk and other low fat dairy products, vegetables, fruits, and whole-grain breads and cereals. Your caregiver will tell you of the ideal weight gain.  A physical sexual relationship may be continued up until near the end of pregnancy if there are no other problems. Problems could include early (premature) leaking of amniotic fluid from the membranes, vaginal bleeding, abdominal pain, or other medical or pregnancy problems.  Exercise regularly if there are no restrictions. Check with your caregiver if you are unsure of the safety of some of your exercises. The greatest weight gain will occur in the last 2 trimesters of pregnancy. Exercise will help you:  Control your weight.  Get you in shape for labor and delivery.  Lose weight   after you have the baby.  Wear a good support or jogging bra for breast tenderness during pregnancy. This may help if worn during sleep. Pads or tissues may be used in the bra if you are leaking colostrum.  Do not use hot tubs, steam rooms or saunas throughout the pregnancy.  Wear your seat belt at all times when driving. This protects you and your baby if you are in an accident.  Avoid raw meat, uncooked cheese, cat litter boxes and soil used by cats. These carry germs that can cause birth defects in the baby.  The second trimester is also a good time to visit your dentist for your dental health if this has not been done yet. Getting your teeth cleaned is OK. Use a soft toothbrush. Brush gently during pregnancy.  It is easier to loose urine during pregnancy. Tightening up and strengthening the pelvic muscles will help with this problem. Practice stopping  your urination while you are going to the bathroom. These are the same muscles you need to strengthen. It is also the muscles you would use as if you were trying to stop from passing gas. You can practice tightening these muscles up 10 times a set and repeating this about 3 times per day. Once you know what muscles to tighten up, do not perform these exercises during urination. It is more likely to contribute to an infection by backing up the urine.  Ask for help if you have financial, counseling or nutritional needs during pregnancy. Your caregiver will be able to offer counseling for these needs as well as refer you for other special needs.  Your skin may become oily. If so, wash your face with mild soap, use non-greasy moisturizer and oil or cream based makeup. MEDICATIONS AND DRUG USE IN PREGNANCY  Take prenatal vitamins as directed. The vitamin should contain 1 milligram of folic acid. Keep all vitamins out of reach of children. Only a couple vitamins or tablets containing iron may be fatal to a baby or young child when ingested.  Avoid use of all medications, including herbs, over-the-counter medications, not prescribed or suggested by your caregiver. Only take over-the-counter or prescription medicines for pain, discomfort, or fever as directed by your caregiver. Do not use aspirin.  Let your caregiver also know about herbs you may be using.  Alcohol is related to a number of birth defects. This includes fetal alcohol syndrome. All alcohol, in any form, should be avoided completely. Smoking will cause low birth rate and premature babies.  Street or illegal drugs are very harmful to the baby. They are absolutely forbidden. A baby born to an addicted mother will be addicted at birth. The baby will go through the same withdrawal an adult does. SEEK MEDICAL CARE IF:  You have any concerns or worries during your pregnancy. It is better to call with your questions if you feel they cannot wait,  rather than worry about them. SEEK IMMEDIATE MEDICAL CARE IF:   An unexplained oral temperature above 102 F (38.9 C) develops, or as your caregiver suggests.  You have leaking of fluid from the vagina (birth canal). If leaking membranes are suspected, take your temperature and tell your caregiver of this when you call.  There is vaginal spotting, bleeding, or passing clots. Tell your caregiver of the amount and how many pads are used. Light spotting in pregnancy is common, especially following intercourse.  You develop a bad smelling vaginal discharge with a change in the color from clear   to white.  You continue to feel sick to your stomach (nauseated) and have no relief from remedies suggested. You vomit blood or coffee ground-like materials.  You lose more than 2 pounds of weight or gain more than 2 pounds of weight over 1 week, or as suggested by your caregiver.  You notice swelling of your face, hands, feet, or legs.  You get exposed to German measles and have never had them.  You are exposed to fifth disease or chickenpox.  You develop belly (abdominal) pain. Round ligament discomfort is a common non-cancerous (benign) cause of abdominal pain in pregnancy. Your caregiver still must evaluate you.  You develop a bad headache that does not go away.  You develop fever, diarrhea, pain with urination, or shortness of breath.  You develop visual problems, blurry, or double vision.  You fall or are in a car accident or any kind of trauma.  There is mental or physical violence at home. Document Released: 03/18/2001 Document Revised: 06/16/2011 Document Reviewed: 09/20/2008 [employer] Patient Information 2013 [employer].  Breastfeeding Deciding to breastfeed is one of the best choices you can make for you and your baby. The information that follows gives a brief overview of the benefits of breastfeeding as well as common topics surrounding breastfeeding. BENEFITS OF  BREASTFEEDING For the baby  The first milk (colostrum) helps the baby's digestive system function better.   There are antibodies in the mother's milk that help the baby fight off infections.   The baby has a lower incidence of asthma, allergies, and sudden infant death syndrome (SIDS).   The nutrients in breast milk are better for the baby than infant formulas, and breast milk helps the baby's brain grow better.   Babies who breastfeed have less gas, colic, and constipation.  For the mother  Breastfeeding helps develop a very special bond between the mother and her baby.   Breastfeeding is convenient, always available at the correct temperature, and costs nothing.   Breastfeeding burns calories in the mother and helps her lose weight that was gained during pregnancy.   Breastfeeding makes the uterus contract back down to normal size faster and slows bleeding following delivery.   Breastfeeding mothers have a lower risk of developing breast cancer.  BREASTFEEDING FREQUENCY  A healthy, full-term baby may breastfeed as often as every hour or space his or her feedings to every 3 hours.   Watch your baby for signs of hunger. Nurse your baby if he or she shows signs of hunger. How often you nurse will vary from baby to baby.   Nurse as often as the baby requests, or when you feel the need to reduce the fullness of your breasts.   Awaken the baby if it has been 3 4 hours since the last feeding.   Frequent feeding will help the mother make more milk and will help prevent problems, such as sore nipples and engorgement of the breasts.  BABY'S POSITION AT THE BREAST  Whether lying down or sitting, be sure that the baby's tummy is facing your tummy.   Support the breast with 4 fingers underneath the breast and the thumb above. Make sure your fingers are well away from the nipple and baby's mouth.   Stroke the baby's lips gently with your finger or nipple.   When the  baby's mouth is open wide enough, place all of your nipple and as much of the areola as possible into your baby's mouth.   Pull the baby in   close so the tip of the nose and the baby's cheeks touch the breast during the feeding.  FEEDINGS AND SUCTION  The length of each feeding varies from baby to baby and from feeding to feeding.   The baby must suck about 2 3 minutes for your milk to get to him or her. This is called a "let down." For this reason, allow the baby to feed on each breast as long as he or she wants. Your baby will end the feeding when he or she has received the right balance of nutrients.   To break the suction, put your finger into the corner of the baby's mouth and slide it between his or her gums before removing your breast from his or her mouth. This will help prevent sore nipples.  HOW TO TELL WHETHER YOUR BABY IS GETTING ENOUGH BREAST MILK. Wondering whether or not your baby is getting enough milk is a common concern among mothers. You can be assured that your baby is getting enough milk if:   Your baby is actively sucking and you hear swallowing.   Your baby seems relaxed and satisfied after a feeding.   Your baby nurses at least 8 12 times in a 24 hour time period. Nurse your baby until he or she unlatches or falls asleep at the first breast (at least 10 20 minutes), then offer the second side.   Your baby is wetting 5 6 disposable diapers (6 8 cloth diapers) in a 24 hour period by 5 6 days of age.   Your baby is having at least 3 4 stools every 24 hours for the first 6 weeks. The stool should be soft and yellow.   Your baby should gain 4 7 ounces per week after he or she is 4 days old.   Your breasts feel softer after nursing.  REDUCING BREAST ENGORGEMENT  In the first week after your baby is born, you may experience signs of breast engorgement. When breasts are engorged, they feel heavy, warm, full, and may be tender to the touch. You can reduce  engorgement if you:   Nurse frequently, every 2 3 hours. Mothers who breastfeed early and often have fewer problems with engorgement.   Place light ice packs on your breasts for 10 20 minutes between feedings. This reduces swelling. Wrap the ice packs in a lightweight towel to protect your skin. Bags of frozen vegetables work well for this purpose.   Take a warm shower or apply warm, moist heat to your breast for 5 10 minutes just before each feeding. This increases circulation and helps the milk flow.   Gently massage your breast before and during the feeding. Using your finger tips, massage from the chest wall towards your nipple in a circular motion.   Make sure that the baby empties at least one breast at every feeding before switching sides.   Use a breast pump to empty the breasts if your baby is sleepy or not nursing well. You may also want to pump if you are returning to work oryou feel you are getting engorged.   Avoid bottle feeds, pacifiers, or supplemental feedings of water or juice in place of breastfeeding. Breast milk is all the food your baby needs. It is not necessary for your baby to have water or formula. In fact, to help your breasts make more milk, it is best not to give your baby supplemental feedings during the early weeks.   Be sure the baby is latched   on and positioned properly while breastfeeding.   Wear a supportive bra, avoiding underwire styles.   Eat a balanced diet with enough fluids.   Rest often, relax, and take your prenatal vitamins to prevent fatigue, stress, and anemia.  If you follow these suggestions, your engorgement should improve in 24 48 hours. If you are still experiencing difficulty, call your lactation consultant or caregiver.  CARING FOR YOURSELF Take care of your breasts  Bathe or shower daily.   Avoid using soap on your nipples.   Start feedings on your left breast at one feeding and on your right breast at the next  feeding.   You will notice an increase in your milk supply 2 5 days after delivery. You may feel some discomfort from engorgement, which makes your breasts very firm and often tender. Engorgement "peaks" out within 24 48 hours. In the meantime, apply warm moist towels to your breasts for 5 10 minutes before feeding. Gentle massage and expression of some milk before feeding will soften your breasts, making it easier for your baby to latch on.   Wear a well-fitting nursing bra, and air dry your nipples for a 3 4minutes after each feeding.   Only use cotton bra pads.   Only use pure lanolin on your nipples after nursing. You do not need to wash it off before feeding the baby again. Another option is to express a few drops of breast milk and gently massage it into your nipples.  Take care of yourself  Eat well-balanced meals and nutritious snacks.   Drinking milk, fruit juice, and water to satisfy your thirst (about 8 glasses a day).   Get plenty of rest.  Avoid foods that you notice affect the baby in a bad way.  SEEK MEDICAL CARE IF:   You have difficulty with breastfeeding and need help.   You have a hard, red, sore area on your breast that is accompanied by a fever.   Your baby is too sleepy to eat well or is having trouble sleeping.   Your baby is wetting less than 6 diapers a day, by 5 days of age.   Your baby's skin or white part of his or her eyes is more yellow than it was in the hospital.   You feel depressed.  Document Released: 03/24/2005 Document Revised: 09/23/2011 Document Reviewed: 06/22/2011 [employer] Patient Information 2013 [employer].  

## 2012-08-24 ENCOUNTER — Encounter: Payer: Self-pay | Admitting: Family Medicine

## 2012-08-24 LAB — GLUCOSE TOLERANCE, 1 HOUR (50G) W/O FASTING: Glucose, 1 Hour GTT: 95 mg/dL (ref 70–140)

## 2012-08-26 ENCOUNTER — Encounter: Payer: Self-pay | Admitting: Family Medicine

## 2012-08-26 ENCOUNTER — Other Ambulatory Visit: Payer: Self-pay | Admitting: Family Medicine

## 2012-08-26 ENCOUNTER — Encounter: Payer: Self-pay | Admitting: *Deleted

## 2012-08-26 DIAGNOSIS — B951 Streptococcus, group B, as the cause of diseases classified elsewhere: Secondary | ICD-10-CM | POA: Insufficient documentation

## 2012-08-26 DIAGNOSIS — O2342 Unspecified infection of urinary tract in pregnancy, second trimester: Secondary | ICD-10-CM

## 2012-08-26 LAB — CULTURE, OB URINE: Colony Count: 70000

## 2012-08-26 MED ORDER — PENICILLIN V POTASSIUM 500 MG PO TABS: 500.0000 mg | ORAL_TABLET | Freq: Four times a day (QID) | ORAL | Status: DC

## 2012-08-26 NOTE — Progress Notes (Signed)
Call the number listed and spoke with someone other than the patient who said they would have her return our call.

## 2012-08-27 NOTE — Progress Notes (Signed)
Attempted to call again. No answer and unable to leave message.

## 2012-08-27 NOTE — Progress Notes (Signed)
Called patients mobile number and a female answered the phone and stated that he was not with [name] at the moment but would get her to call us back.

## 2012-08-31 ENCOUNTER — Encounter: Payer: Self-pay | Admitting: Family Medicine

## 2012-08-31 NOTE — Progress Notes (Signed)
Letter sent to patient.

## 2012-08-31 NOTE — Progress Notes (Signed)
Called pt and left message that it is very important that she call us regarding test results. Please call us back and state whether we can leave  detailed information on your voice mail.

## 2012-09-13 ENCOUNTER — Ambulatory Visit (HOSPITAL_COMMUNITY)
Admission: RE | Admit: 2012-09-13 | Discharge: 2012-09-13 | Disposition: A | Discharge status: Home / Self Care | Payer: Medicaid Other | Source: Ambulatory Visit | Attending: Family Medicine | Admitting: Family Medicine

## 2012-09-13 DIAGNOSIS — J45909 Unspecified asthma, uncomplicated: Secondary | ICD-10-CM

## 2012-09-13 DIAGNOSIS — Z3402 Encounter for supervision of normal first pregnancy, second trimester: Secondary | ICD-10-CM

## 2012-09-13 DIAGNOSIS — Z3689 Encounter for other specified antenatal screening: Secondary | ICD-10-CM | POA: Insufficient documentation

## 2012-09-13 DIAGNOSIS — I82621 Acute embolism and thrombosis of deep veins of right upper extremity: Secondary | ICD-10-CM

## 2012-09-14 ENCOUNTER — Encounter: Payer: Self-pay | Admitting: Family Medicine

## 2012-09-20 ENCOUNTER — Encounter: Payer: Self-pay | Admitting: Obstetrics and Gynecology

## 2012-09-27 ENCOUNTER — Ambulatory Visit (INDEPENDENT_AMBULATORY_CARE_PROVIDER_SITE_OTHER): Payer: Medicaid Other | Admitting: Family Medicine

## 2012-09-27 VITALS — BP 111/63 | Wt 169.8 lb

## 2012-09-27 DIAGNOSIS — Z3402 Encounter for supervision of normal first pregnancy, second trimester: Secondary | ICD-10-CM

## 2012-09-27 DIAGNOSIS — O2390 Unspecified genitourinary tract infection in pregnancy, unspecified trimester: Secondary | ICD-10-CM

## 2012-09-27 LAB — POCT URINALYSIS DIP (DEVICE)
Glucose, UA: NEGATIVE mg/dL
Leukocytes, UA: NEGATIVE
Nitrite: NEGATIVE
Urobilinogen, UA: 0.2 mg/dL (ref 0.0–1.0)

## 2012-09-27 NOTE — Patient Instructions (Signed)
Pregnancy - Second Trimester The second trimester of pregnancy (3 to 6 months) is a period of rapid growth for you and your baby. At the end of the sixth month, your baby is about 9 inches long and weighs 1 1/2 pounds. You will begin to feel the baby move between 18 and 20 weeks of the pregnancy. This is called quickening. Weight gain is faster. A clear fluid (colostrum) may leak out of your breasts. You may feel small contractions of the womb (uterus). This is known as false labor or Braxton-Hicks contractions. This is like a practice for labor when the baby is ready to be born. Usually, the problems with morning sickness have usually passed by the end of your first trimester. Some women develop small dark blotches (called cholasma, mask of pregnancy) on their face that usually goes away after the baby is born. Exposure to the sun makes the blotches worse. Acne may also develop in some pregnant women and pregnant women who have acne, may find that it goes away. PRENATAL EXAMS  Blood work may continue to be done during prenatal exams. These tests are done to check on your health and the probable health of your baby. Blood work is used to follow your blood levels (hemoglobin). Anemia (low hemoglobin) is common during pregnancy. Iron and vitamins are given to help prevent this. You will also be checked for diabetes between 24 and 28 weeks of the pregnancy. Some of the previous blood tests may be repeated.  The size of the uterus is measured during each visit. This is to make sure that the baby is continuing to grow properly according to the dates of the pregnancy.  Your blood pressure is checked every prenatal visit. This is to make sure you are not getting toxemia.  Your urine is checked to make sure you do not have an infection, diabetes or protein in the urine.  Your weight is checked often to make sure gains are happening at the suggested rate. This is to ensure that both you and your baby are  growing normally.  Sometimes, an ultrasound is performed to confirm the proper growth and development of the baby. This is a test which bounces harmless sound waves off the baby so your caregiver can more accurately determine due dates. Sometimes, a test is done on the amniotic fluid surrounding the baby. This test is called an amniocentesis. The amniotic fluid is obtained by sticking a needle into the belly (abdomen). This is done to check the chromosomes in instances where there is a concern about possible genetic problems with the baby. It is also sometimes done near the end of pregnancy if an early delivery is required. In this case, it is done to help make sure the baby's lungs are mature enough for the baby to live outside of the womb. CHANGES OCCURING IN THE SECOND TRIMESTER OF PREGNANCY Your body goes through many changes during pregnancy. They vary from person to person. Talk to your caregiver about changes you notice that you are concerned about.  During the second trimester, you will likely have an increase in your appetite. It is normal to have cravings for certain foods. This varies from person to person and pregnancy to pregnancy.  Your lower abdomen will begin to bulge.  You may have to urinate more often because the uterus and baby are pressing on your bladder. It is also common to get more bladder infections during pregnancy. You can help this by drinking lots of fluids   and emptying your bladder before and after intercourse.  You may begin to get stretch marks on your hips, abdomen, and breasts. These are normal changes in the body during pregnancy. There are no exercises or medicines to take that prevent this change.  You may begin to develop swollen and bulging veins (varicose veins) in your legs. Wearing support hose, elevating your feet for 15 minutes, 3 to 4 times a day and limiting salt in your diet helps lessen the problem.  Heartburn may develop as the uterus grows and  pushes up against the stomach. Antacids recommended by your caregiver helps with this problem. Also, eating smaller meals 4 to 5 times a day helps.  Constipation can be treated with a stool softener or adding bulk to your diet. Drinking lots of fluids, and eating vegetables, fruits, and whole grains are helpful.  Exercising is also helpful. If you have been very active up until your pregnancy, most of these activities can be continued during your pregnancy. If you have been less active, it is helpful to start an exercise program such as walking.  Hemorrhoids may develop at the end of the second trimester. Warm sitz baths and hemorrhoid cream recommended by your caregiver helps hemorrhoid problems.  Backaches may develop during this time of your pregnancy. Avoid heavy lifting, wear low heal shoes, and practice good posture to help with backache problems.  Some pregnant women develop tingling and numbness of their hand and fingers because of swelling and tightening of ligaments in the wrist (carpel tunnel syndrome). This goes away after the baby is born.  As your breasts enlarge, you may have to get a bigger bra. Get a comfortable, cotton, support bra. Do not get a nursing bra until the last month of the pregnancy if you will be nursing the baby.  You may get a dark line from your belly button to the pubic area called the linea nigra.  You may develop rosy cheeks because of increase blood flow to the face.  You may develop spider looking lines of the face, neck, arms, and chest. These go away after the baby is born. HOME CARE INSTRUCTIONS   It is extremely important to avoid all smoking, herbs, alcohol, and unprescribed drugs during your pregnancy. These chemicals affect the formation and growth of the baby. Avoid these chemicals throughout the pregnancy to ensure the delivery of a healthy infant.  Most of your home care instructions are the same as suggested for the first trimester of your  pregnancy. Keep your caregiver's appointments. Follow your caregiver's instructions regarding medicine use, exercise, and diet.  During pregnancy, you are providing food for you and your baby. Continue to eat regular, well-balanced meals. Choose foods such as meat, fish, milk and other low fat dairy products, vegetables, fruits, and whole-grain breads and cereals. Your caregiver will tell you of the ideal weight gain.  A physical sexual relationship may be continued up until near the end of pregnancy if there are no other problems. Problems could include early (premature) leaking of amniotic fluid from the membranes, vaginal bleeding, abdominal pain, or other medical or pregnancy problems.  Exercise regularly if there are no restrictions. Check with your caregiver if you are unsure of the safety of some of your exercises. The greatest weight gain will occur in the last 2 trimesters of pregnancy. Exercise will help you:  Control your weight.  Get you in shape for labor and delivery.  Lose weight after you have the baby.  Wear   a good support or jogging bra for breast tenderness during pregnancy. This may help if worn during sleep. Pads or tissues may be used in the bra if you are leaking colostrum.  Do not use hot tubs, steam rooms or saunas throughout the pregnancy.  Wear your seat belt at all times when driving. This protects you and your baby if you are in an accident.  Avoid raw meat, uncooked cheese, cat litter boxes, and soil used by cats. These carry germs that can cause birth defects in the baby.  The second trimester is also a good time to visit your dentist for your dental health if this has not been done yet. Getting your teeth cleaned is okay. Use a soft toothbrush. Brush gently during pregnancy.  It is easier to leak urine during pregnancy. Tightening up and strengthening the pelvic muscles will help with this problem. Practice stopping your urination while you are going to the  bathroom. These are the same muscles you need to strengthen. It is also the muscles you would use as if you were trying to stop from passing gas. You can practice tightening these muscles up 10 times a set and repeating this about 3 times per day. Once you know what muscles to tighten up, do not perform these exercises during urination. It is more likely to contribute to an infection by backing up the urine.  Ask for help if you have financial, counseling, or nutritional needs during pregnancy. Your caregiver will be able to offer counseling for these needs as well as refer you for other special needs.  Your skin may become oily. If so, wash your face with mild soap, use non-greasy moisturizer and oil or cream based makeup. MEDICINES AND DRUG USE IN PREGNANCY  Take prenatal vitamins as directed. The vitamin should contain 1 milligram of folic acid. Keep all vitamins out of reach of children. Only a couple vitamins or tablets containing iron may be fatal to a baby or young child when ingested.  Avoid use of all medicines, including herbs, over-the-counter medicines, not prescribed or suggested by your caregiver. Only take over-the-counter or prescription medicines for pain, discomfort, or fever as directed by your caregiver. Do not use aspirin.  Let your caregiver also know about herbs you may be using.  Alcohol is related to a number of birth defects. This includes fetal alcohol syndrome. All alcohol, in any form, should be avoided completely. Smoking will cause low birth rate and premature babies.  Street or illegal drugs are very harmful to the baby. They are absolutely forbidden. A baby born to an addicted mother will be addicted at birth. The baby will go through the same withdrawal an adult does. SEEK MEDICAL CARE IF:  You have any concerns or worries during your pregnancy. It is better to call with your questions if you feel they cannot wait, rather than worry about them. SEEK IMMEDIATE  MEDICAL CARE IF:   An unexplained oral temperature above 102 F (38.9 C) develops, or as your caregiver suggests.  You have leaking of fluid from the vagina (birth canal). If leaking membranes are suspected, take your temperature and tell your caregiver of this when you call.  There is vaginal spotting, bleeding, or passing clots. Tell your caregiver of the amount and how many pads are used. Light spotting in pregnancy is common, especially following intercourse.  You develop a bad smelling vaginal discharge with a change in the color from clear to white.  You continue to feel   sick to your stomach (nauseated) and have no relief from remedies suggested. You vomit blood or coffee ground-like materials.  You lose more than 2 pounds of weight or gain more than 2 pounds of weight over 1 week, or as suggested by your caregiver.  You notice swelling of your face, hands, feet, or legs.  You get exposed to German measles and have never had them.  You are exposed to fifth disease or chickenpox.  You develop belly (abdominal) pain. Round ligament discomfort is a common non-cancerous (benign) cause of abdominal pain in pregnancy. Your caregiver still must evaluate you.  You develop a bad headache that does not go away.  You develop fever, diarrhea, pain with urination, or shortness of breath.  You develop visual problems, blurry, or double vision.  You fall or are in a car accident or any kind of trauma.  There is mental or physical violence at home. Document Released: 03/18/2001 Document Revised: 12/17/2011 Document Reviewed: 09/20/2008 [employer] Patient Information 2014 [employer].  

## 2012-09-27 NOTE — Progress Notes (Signed)
Occasional cramping, no bleeding or vaginal discharge. Feeling flutters.

## 2012-09-27 NOTE — Progress Notes (Signed)
Pulse: 89

## 2012-10-13 ENCOUNTER — Inpatient Hospital Stay (HOSPITAL_COMMUNITY)
Admission: AD | Admit: 2012-10-13 | Discharge: 2012-10-13 | Disposition: A | Discharge status: Home / Self Care | Payer: Medicaid Other | Source: Ambulatory Visit | Attending: Obstetrics & Gynecology | Admitting: Obstetrics & Gynecology

## 2012-10-13 ENCOUNTER — Encounter (HOSPITAL_COMMUNITY): Payer: Self-pay

## 2012-10-13 DIAGNOSIS — R109 Unspecified abdominal pain: Secondary | ICD-10-CM | POA: Insufficient documentation

## 2012-10-13 DIAGNOSIS — Z3402 Encounter for supervision of normal first pregnancy, second trimester: Secondary | ICD-10-CM

## 2012-10-13 DIAGNOSIS — M549 Dorsalgia, unspecified: Secondary | ICD-10-CM | POA: Insufficient documentation

## 2012-10-13 DIAGNOSIS — O99891 Other specified diseases and conditions complicating pregnancy: Secondary | ICD-10-CM | POA: Insufficient documentation

## 2012-10-13 DIAGNOSIS — B951 Streptococcus, group B, as the cause of diseases classified elsewhere: Secondary | ICD-10-CM

## 2012-10-13 DIAGNOSIS — O2390 Unspecified genitourinary tract infection in pregnancy, unspecified trimester: Secondary | ICD-10-CM

## 2012-10-13 LAB — CBC WITH DIFFERENTIAL/PLATELET
Eosinophils Absolute: 0.1 10*3/uL (ref 0.0–0.7)
Hemoglobin: 11.1 g/dL — ABNORMAL LOW (ref 12.0–15.0)
Lymphs Abs: 2.2 10*3/uL (ref 0.7–4.0)
MCH: 30 pg (ref 26.0–34.0)
Monocytes Relative: 7 % (ref 3–12)
Neutro Abs: 9 10*3/uL — ABNORMAL HIGH (ref 1.7–7.7)
Neutrophils Relative %: 74 % (ref 43–77)
RBC: 3.7 MIL/uL — ABNORMAL LOW (ref 3.87–5.11)

## 2012-10-13 LAB — BASIC METABOLIC PANEL
CO2: 25 mEq/L (ref 19–32)
Chloride: 99 mEq/L (ref 96–112)
GFR calc non Af Amer: >90 mL/min (ref >90)
Glucose, Bld: 81 mg/dL (ref 70–99)
Potassium: 4.1 mEq/L (ref 3.5–5.1)
Sodium: 134 mEq/L — ABNORMAL LOW (ref 135–145)

## 2012-10-13 LAB — URINALYSIS, ROUTINE W REFLEX MICROSCOPIC
Leukocytes, UA: NEGATIVE
Nitrite: NEGATIVE
Protein, ur: NEGATIVE mg/dL
Urobilinogen, UA: 0.2 mg/dL (ref 0.0–1.0)

## 2012-10-13 LAB — WET PREP, GENITAL: Yeast Wet Prep HPF POC: NONE SEEN

## 2012-10-13 MED ORDER — ACETAMINOPHEN 325 MG PO TABS
650.0000 mg | ORAL_TABLET | Freq: Once | ORAL | Status: AC
  Administered 2012-10-13: 650 mg via ORAL
  Filled 2012-10-13: qty 2

## 2012-10-13 NOTE — MAU Note (Signed)
Patient is in with c/o 3 days of lower/mid back, abdominal pain with n/v. She denies vaginal bleeding, abnormal discharge or lof. She reports good fetal movement. She have flank tenderness, patient denies dysuria or fever. Abdomen is soft, but patient complains of tenderness.

## 2012-10-13 NOTE — MAU Note (Signed)
Patient states she has been having lower abdominal, back and rib pain since last night. Has had vomiting since last night. Denies bleeding or discharge.
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
## 2012-10-14 LAB — GC/CHLAMYDIA PROBE AMP: CT Probe RNA: NEGATIVE

## 2012-10-14 NOTE — MAU Provider Note (Signed)
Attestation of Attending Supervision of Obstetric Fellow: Evaluation and management procedures were performed by the Obstetric Fellow under my supervision and collaboration.  I have reviewed the Obstetric Fellow's note and chart, and I agree with the management and plan.  Norma Montemurro, MD, FACOG Attending Obstetrician & Gynecologist Faculty Practice, [facility]   

## 2012-10-25 ENCOUNTER — Ambulatory Visit (INDEPENDENT_AMBULATORY_CARE_PROVIDER_SITE_OTHER): Payer: Medicaid Other | Admitting: Obstetrics & Gynecology

## 2012-10-25 VITALS — BP 122/70 | Temp 96.5°F

## 2012-10-25 DIAGNOSIS — Z3402 Encounter for supervision of normal first pregnancy, second trimester: Secondary | ICD-10-CM

## 2012-10-25 DIAGNOSIS — J45901 Unspecified asthma with (acute) exacerbation: Secondary | ICD-10-CM

## 2012-10-25 DIAGNOSIS — J4521 Mild intermittent asthma with (acute) exacerbation: Secondary | ICD-10-CM

## 2012-10-25 LAB — POCT URINALYSIS DIP (DEVICE)
Bilirubin Urine: NEGATIVE
Hgb urine dipstick: NEGATIVE
Ketones, ur: NEGATIVE mg/dL
pH: 6 (ref 5.0–8.0)

## 2012-10-25 MED ORDER — CETIRIZINE-PSEUDOEPHEDRINE ER 5-120 MG PO TB12: 1.0000 | ORAL_TABLET | Freq: Two times a day (BID) | ORAL | Status: DC

## 2012-10-25 MED ORDER — GUAIFENESIN 100 MG/5ML PO SOLN: 5.0000 mL | ORAL | Status: DC | PRN

## 2012-10-25 NOTE — Progress Notes (Signed)
Pt having nasal stuffiness.  No wheezing.  Antibiotics didn't work.  Prob viral or allergies.  Lungs CTAB.  Zyrtec D prn and Robitussin for cough.

## 2012-10-25 NOTE — Progress Notes (Signed)
Pulse 80 Edema trace in feet. C/o pelvic and lower back pain.

## 2012-11-22 ENCOUNTER — Ambulatory Visit (INDEPENDENT_AMBULATORY_CARE_PROVIDER_SITE_OTHER): Payer: Medicaid Other | Admitting: Family

## 2012-11-22 VITALS — BP 118/70 | Temp 96.6°F | Wt 176.9 lb

## 2012-11-22 DIAGNOSIS — Z3402 Encounter for supervision of normal first pregnancy, second trimester: Secondary | ICD-10-CM

## 2012-11-22 DIAGNOSIS — O2390 Unspecified genitourinary tract infection in pregnancy, unspecified trimester: Secondary | ICD-10-CM

## 2012-11-22 LAB — CBC
HCT: 32.4 % — ABNORMAL LOW (ref 36.0–46.0)
Hemoglobin: 11.1 g/dL — ABNORMAL LOW (ref 12.0–15.0)
MCH: 30.3 pg (ref 26.0–34.0)
MCV: 88.5 fL (ref 78.0–100.0)
RBC: 3.66 MIL/uL — ABNORMAL LOW (ref 3.87–5.11)

## 2012-11-22 LAB — POCT URINALYSIS DIP (DEVICE)
Bilirubin Urine: NEGATIVE
Hgb urine dipstick: NEGATIVE
Ketones, ur: NEGATIVE mg/dL
pH: 7 (ref 5.0–8.0)

## 2012-11-22 NOTE — Progress Notes (Signed)
Pulse- 90 Patient reports lower abdominal pressure and occasional sharp pains

## 2012-11-22 NOTE — Progress Notes (Signed)
No questions or concerns; 1 hr GCT obtained.

## 2012-11-24 ENCOUNTER — Encounter: Payer: Self-pay | Admitting: Family

## 2012-12-07 ENCOUNTER — Encounter: Payer: Self-pay | Admitting: *Deleted

## 2012-12-08 ENCOUNTER — Ambulatory Visit (INDEPENDENT_AMBULATORY_CARE_PROVIDER_SITE_OTHER): Payer: Medicaid Other | Admitting: Family Medicine

## 2012-12-08 VITALS — BP 125/76 | Temp 98.4°F | Wt 179.0 lb

## 2012-12-08 DIAGNOSIS — N898 Other specified noninflammatory disorders of vagina: Secondary | ICD-10-CM

## 2012-12-08 DIAGNOSIS — O9989 Other specified diseases and conditions complicating pregnancy, childbirth and the puerperium: Secondary | ICD-10-CM

## 2012-12-08 DIAGNOSIS — Z3402 Encounter for supervision of normal first pregnancy, second trimester: Secondary | ICD-10-CM

## 2012-12-08 LAB — POCT URINALYSIS DIP (DEVICE)
Bilirubin Urine: NEGATIVE
Glucose, UA: NEGATIVE mg/dL
Hgb urine dipstick: NEGATIVE
Ketones, ur: NEGATIVE mg/dL
Nitrite: NEGATIVE
Specific Gravity, Urine: 1.015 (ref 1.005–1.030)
pH: 6.5 (ref 5.0–8.0)

## 2012-12-08 LAB — OB RESULTS CONSOLE GC/CHLAMYDIA: Gonorrhea: NEGATIVE

## 2012-12-08 NOTE — Patient Instructions (Signed)

## 2012-12-08 NOTE — Progress Notes (Signed)
Pt is a 22 yo G2P0010 here for ROB.   S: pt complaining of tiredness and light headedness. Drinking water although in spurts and not regularly.  Having some clear discharge intermittently. Not wearing a panty liner. No recent std exposure.   O: physiologic discharge  O: see flowsheet  A/p - wet prep - gc/chl done today for discharge - CBC with normocytic anemia to 11. With fatigue, recommend increasing water and making sure PNV has iron in it - reassurance given otherwise - return precautions discussed - fu in 2 weeks.

## 2012-12-08 NOTE — Progress Notes (Signed)
Pulse 117  Edema trace in feet. C/o rash on chest. C/o lightheadedness and feeling of heart pounding. C/o pain on pelvic and lower back.

## 2012-12-09 LAB — WET PREP, GENITAL
Trich, Wet Prep: NONE SEEN
Yeast Wet Prep HPF POC: NONE SEEN

## 2012-12-09 LAB — GC/CHLAMYDIA PROBE AMP: GC Probe RNA: NEGATIVE

## 2012-12-10 ENCOUNTER — Encounter (HOSPITAL_COMMUNITY): Payer: Self-pay

## 2012-12-10 ENCOUNTER — Inpatient Hospital Stay (HOSPITAL_COMMUNITY)
Admission: AD | Admit: 2012-12-10 | Discharge: 2012-12-10 | Disposition: A | Discharge status: Home / Self Care | Payer: Medicaid Other | Source: Ambulatory Visit | Attending: Obstetrics & Gynecology | Admitting: Obstetrics & Gynecology

## 2012-12-10 DIAGNOSIS — R51 Headache: Secondary | ICD-10-CM | POA: Insufficient documentation

## 2012-12-10 DIAGNOSIS — N949 Unspecified condition associated with female genital organs and menstrual cycle: Secondary | ICD-10-CM | POA: Insufficient documentation

## 2012-12-10 LAB — URINALYSIS, ROUTINE W REFLEX MICROSCOPIC
Bilirubin Urine: NEGATIVE
Ketones, ur: 15 mg/dL — AB
Nitrite: NEGATIVE
Protein, ur: NEGATIVE mg/dL
pH: 8 (ref 5.0–8.0)

## 2012-12-10 NOTE — MAU Provider Note (Signed)
Attestation of Attending Supervision of Advanced Practitioner (PA/CNM/NP): Evaluation and management procedures were performed by the Advanced Practitioner under my supervision and collaboration.  I have reviewed the Advanced Practitioner's note and chart, and I agree with the management and plan.  Wauneta Silveria, MD, FACOG Attending Obstetrician & Gynecologist Faculty Practice, [facility]  

## 2012-12-10 NOTE — Progress Notes (Signed)
Dr Reola Calkins notified of pt's arrival and complaints, states she will come and evaluate pt after a delivery.

## 2012-12-10 NOTE — MAU Provider Note (Signed)
Went in to see patient and patient informed me that she could not stay to be seen. Discussed risk vs benefit with patient Pt signed AMA form  Iona Hansen Rasch, NP 12/10/2012 5:45 PM

## 2012-12-10 NOTE — MAU Note (Signed)
Patient states she had been having increasing pelvic pain for 2 days. Has a heavy discharge for about one week but states it continues and is watery. Has had a cold for a couple of days and a headache. Reports good fetal movement. No bleeding. Has had vomiting, worse with coughing.

## 2012-12-10 NOTE — MAU Note (Signed)
Dr Reola Calkins notified of pt's complaints of needing to leave and I ask if NP could evaluate pt, she states that would be fine.

## 2012-12-10 NOTE — MAU Note (Signed)
Pt states that she needs to leave because her ride is here and she has children to pick up. Darlene Carbon NP notified of pt's complaints and talked with pt about plan of care and suggested to her that if her symptoms worsening she needs to reconsider to come back in to be evaluated.

## 2012-12-22 ENCOUNTER — Ambulatory Visit (INDEPENDENT_AMBULATORY_CARE_PROVIDER_SITE_OTHER): Payer: Medicaid Other | Admitting: Family Medicine

## 2012-12-22 VITALS — BP 123/72 | Temp 97.6°F | Wt 174.0 lb

## 2012-12-22 DIAGNOSIS — O2390 Unspecified genitourinary tract infection in pregnancy, unspecified trimester: Secondary | ICD-10-CM

## 2012-12-22 DIAGNOSIS — Z3403 Encounter for supervision of normal first pregnancy, third trimester: Secondary | ICD-10-CM

## 2012-12-22 LAB — POCT URINALYSIS DIP (DEVICE)
Ketones, ur: NEGATIVE mg/dL
Protein, ur: NEGATIVE mg/dL
Specific Gravity, Urine: 1.015 (ref 1.005–1.030)
Urobilinogen, UA: 1 mg/dL (ref 0.0–1.0)
pH: 7.5 (ref 5.0–8.0)

## 2012-12-22 NOTE — Progress Notes (Signed)
Pt is a 22 yo G2P0010 here for ROB.   S: pt states has some nausea adn vomiting. Believes it may be due to the iron.  No ctx, vb, LOF. +FM  O: see flowsheet  A/P:  - labor precautions given - ok to stop iron for now- hgb 11.1. Pt declined antiemetic medication - discussed whawt to expect in albor and subsequent visits - pt with multiple questions.  -f/u in 2 week for 36 week visit.

## 2012-12-22 NOTE — Patient Instructions (Signed)

## 2012-12-22 NOTE — Progress Notes (Signed)
Pulse  95 C/o lower back pain.

## 2013-01-05 ENCOUNTER — Encounter: Payer: Medicaid Other | Admitting: Family

## 2013-01-13 ENCOUNTER — Inpatient Hospital Stay (HOSPITAL_COMMUNITY)
Admission: AD | Admit: 2013-01-13 | Discharge: 2013-01-13 | Disposition: A | Discharge status: Home / Self Care | Payer: Medicaid Other | Source: Ambulatory Visit | Attending: Obstetrics & Gynecology | Admitting: Obstetrics & Gynecology

## 2013-01-13 ENCOUNTER — Encounter (HOSPITAL_COMMUNITY): Payer: Self-pay

## 2013-01-13 ENCOUNTER — Encounter: Payer: Medicaid Other | Admitting: Obstetrics and Gynecology

## 2013-01-13 DIAGNOSIS — O99891 Other specified diseases and conditions complicating pregnancy: Secondary | ICD-10-CM | POA: Insufficient documentation

## 2013-01-13 DIAGNOSIS — Z22330 Carrier of Group B streptococcus: Secondary | ICD-10-CM | POA: Insufficient documentation

## 2013-01-13 DIAGNOSIS — O479 False labor, unspecified: Secondary | ICD-10-CM | POA: Insufficient documentation

## 2013-01-13 LAB — WET PREP, GENITAL
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
## 2013-01-13 NOTE — MAU Note (Signed)
Patient is in with c/o possible rupture of membranes this morning at 0700am. She states that she woke up and her pants and bed was wet. No leaking since. Patient states that she started having intermittent lower back pain. She denies vaginal bleeding. Reports good fetal movement. She states that she had an OB appt at the clinic today at 1530pm.

## 2013-01-13 NOTE — Progress Notes (Signed)
Dr Claiborne Billings notified of patient, her complaints of possible srom and negative fern. Will see patient as soon as possible

## 2013-01-13 NOTE — MAU Note (Addendum)
Woke up this morning in a puddle of liquid, clear.  Small amt more initially. None since. Also having pain in low back now

## 2013-01-19 ENCOUNTER — Ambulatory Visit (INDEPENDENT_AMBULATORY_CARE_PROVIDER_SITE_OTHER): Payer: Medicaid Other | Admitting: Obstetrics & Gynecology

## 2013-01-19 VITALS — BP 127/84 | Temp 97.7°F | Wt 171.4 lb

## 2013-01-19 DIAGNOSIS — IMO0002 Reserved for concepts with insufficient information to code with codable children: Secondary | ICD-10-CM

## 2013-01-19 DIAGNOSIS — O2390 Unspecified genitourinary tract infection in pregnancy, unspecified trimester: Secondary | ICD-10-CM

## 2013-01-19 DIAGNOSIS — R6889 Other general symptoms and signs: Secondary | ICD-10-CM

## 2013-01-19 DIAGNOSIS — Z3403 Encounter for supervision of normal first pregnancy, third trimester: Secondary | ICD-10-CM

## 2013-01-19 DIAGNOSIS — Z23 Encounter for immunization: Secondary | ICD-10-CM

## 2013-01-19 DIAGNOSIS — B951 Streptococcus, group B, as the cause of diseases classified elsewhere: Secondary | ICD-10-CM

## 2013-01-19 LAB — POCT URINALYSIS DIP (DEVICE)
Glucose, UA: NEGATIVE mg/dL
Ketones, ur: 15 mg/dL — AB
Nitrite: NEGATIVE
Protein, ur: 100 mg/dL — AB
Specific Gravity, Urine: 1.02 (ref 1.005–1.030)
pH: 7 (ref 5.0–8.0)

## 2013-01-19 MED ORDER — TETANUS-DIPHTH-ACELL PERTUSSIS 5-2.5-18.5 LF-MCG/0.5 IM SUSP: 0.5000 mL | Freq: Once | INTRAMUSCULAR | Status: DC

## 2013-01-19 NOTE — Progress Notes (Signed)
2+ proteinuria, normal BP, continue to watch.  Tdap and flu vaccines today.  No other complaints or concerns.  Fetal movement and labor precautions reviewed.

## 2013-01-19 NOTE — Patient Instructions (Signed)
Return to clinic for any obstetric concerns or go to MAU for evaluation  

## 2013-01-19 NOTE — Progress Notes (Signed)
P= 117 Pain in lower back increase with movement/. Braxton Hick's contractions Irregular. Watery mucus discharge continues. Flu vaccine information given will consider.  Desires Flu vaccine consent signed.

## 2013-01-25 ENCOUNTER — Ambulatory Visit (INDEPENDENT_AMBULATORY_CARE_PROVIDER_SITE_OTHER): Payer: Medicaid Other | Admitting: Family Medicine

## 2013-01-25 VITALS — BP 131/78 | Temp 97.1°F | Wt 148.5 lb

## 2013-01-25 DIAGNOSIS — O2390 Unspecified genitourinary tract infection in pregnancy, unspecified trimester: Secondary | ICD-10-CM

## 2013-01-25 DIAGNOSIS — Z3403 Encounter for supervision of normal first pregnancy, third trimester: Secondary | ICD-10-CM

## 2013-01-25 NOTE — Progress Notes (Signed)
  Subjective:    [name] is a 22 y.o. female being seen today for her obstetrical visit. She is at [redacted]w[redacted]d gestation. Patient reports no bleeding, no contractions, no cramping and no leaking. Fetal movement: normal.  Menstrual History: OB History   Grav Para Term Preterm Abortions TAB SAB Ect Mult Living   2 0 0 0 1   1 0 0       Patient's last menstrual period was 05/05/2012.    The following portions of the patient's history were reviewed and updated as appropriate: allergies, current medications, past family history, past medical history, past social history, past surgical history and problem list.  Review of Systems Pertinent items are noted in HPI.   Objective:    BP 131/78  Temp(Src) 97.1 F (36.2 C)  Wt 67.359 kg (148 lb 8 oz)  BMI 28.77 kg/m2  LMP 05/05/2012 FHT: 143 BPM  Uterine Size: 36 cm and size equals dates                               Assessment:   [name] is a 22 y.o. G2P0010 at [redacted]w[redacted]d here for ROB visit.    Discussed with Patient:  - Plans to breast/bottle feed.  All questions answered. - Continue prenatal vitamins. - Reviewed fetal kick counts Pt to perform daily at a time when the baby is active, lie laterally with both hands on belly in quiet room and count all movements (hiccups, shoulder rolls, obvious kicks, etc); pt is to report to clinic MAU for less than 10 movements felt in a 2 hour time period-pt told as soon as she counts 10 movements the count is complete.  - Routine precautions discussed (depression, infection s/s).   Patient provided with all pertinent phone numbers for emergencies. - RTC for any VB, regular, painful cramps/ctxs occurring at a rate of >2/10 min, fever (100.5 or higher), n/v/d, any pain that is unresolving or worsening, LOF, decreased fetal movement, CP, SOB, edema -RTC in one week for next visit.  Problems: Patient Active Problem List   Diagnosis Date Noted  . Group b Streptococcus urinary tract infection  complicating pregnancy in second trimester 08/26/2012  . Supervision of normal first pregnancy 08/23/2012  . Asthma 08/23/2012  . DVT of upper extremity (deep vein thrombosis)-right after trauma 08/23/2012  . LGSIL (low grade squamous intraepithelial lesion) on Pap smear 08/23/2012    To Do: 1.   [ ]  Vaccines: Flu: recd Tdap: rec [ ]  BCM: undecided [ ]  Readiness: baby has a place to sleep, car seat, other baby necessities.  Edu: [x ] TL precautions; [ ]  BF class; [ ]  childbirth class; [ ]   BF counseling;

## 2013-01-25 NOTE — Progress Notes (Signed)
Pulse- 98  Pain/pressure-lower abd, back

## 2013-01-25 NOTE — Patient Instructions (Signed)

## 2013-01-27 ENCOUNTER — Encounter: Payer: Self-pay | Admitting: *Deleted

## 2013-01-27 LAB — POCT URINALYSIS DIP (DEVICE)
Bilirubin Urine: NEGATIVE
Glucose, UA: NEGATIVE mg/dL
Leukocytes, UA: NEGATIVE
Protein, ur: NEGATIVE mg/dL
Specific Gravity, Urine: 1.015 (ref 1.005–1.030)
Urobilinogen, UA: 0.2 mg/dL (ref 0.0–1.0)
pH: 7 (ref 5.0–8.0)

## 2013-02-01 ENCOUNTER — Encounter: Payer: Self-pay | Admitting: Family Medicine

## 2013-02-01 ENCOUNTER — Ambulatory Visit (INDEPENDENT_AMBULATORY_CARE_PROVIDER_SITE_OTHER): Payer: Medicaid Other | Admitting: Family Medicine

## 2013-02-01 VITALS — BP 134/84 | Temp 97.4°F | Wt 178.7 lb

## 2013-02-01 DIAGNOSIS — B951 Streptococcus, group B, as the cause of diseases classified elsewhere: Secondary | ICD-10-CM

## 2013-02-01 DIAGNOSIS — R6889 Other general symptoms and signs: Secondary | ICD-10-CM

## 2013-02-01 DIAGNOSIS — Z3403 Encounter for supervision of normal first pregnancy, third trimester: Secondary | ICD-10-CM

## 2013-02-01 DIAGNOSIS — IMO0002 Reserved for concepts with insufficient information to code with codable children: Secondary | ICD-10-CM

## 2013-02-01 DIAGNOSIS — O2390 Unspecified genitourinary tract infection in pregnancy, unspecified trimester: Secondary | ICD-10-CM

## 2013-02-01 LAB — POCT URINALYSIS DIP (DEVICE)
Bilirubin Urine: NEGATIVE
Glucose, UA: NEGATIVE mg/dL
Hgb urine dipstick: NEGATIVE
Leukocytes, UA: NEGATIVE
Nitrite: NEGATIVE
Urobilinogen, UA: 0.2 mg/dL (ref 0.0–1.0)

## 2013-02-01 NOTE — Patient Instructions (Signed)

## 2013-02-01 NOTE — Progress Notes (Signed)
P= 110 Pt. C/o of vaginal pain/pressure; describes it as sharp in nature for the last 4 days hours apart.

## 2013-02-01 NOTE — Progress Notes (Signed)
Pt is [redacted]w[redacted]d today.  - having some occasional pains but nothing regular - no vb, lof. +FM  O: see flowsheet  A/P - labor precautions discussed - start twice weekly testing on Friday this week.  - need GBS ppx in labor - discussed need for induction at 41 week and will likely schedule at NST appt.

## 2013-02-04 ENCOUNTER — Telehealth (HOSPITAL_COMMUNITY): Payer: Self-pay | Admitting: *Deleted

## 2013-02-04 ENCOUNTER — Ambulatory Visit (INDEPENDENT_AMBULATORY_CARE_PROVIDER_SITE_OTHER): Payer: Medicaid Other | Admitting: *Deleted

## 2013-02-04 VITALS — BP 128/67

## 2013-02-04 DIAGNOSIS — O480 Post-term pregnancy: Secondary | ICD-10-CM

## 2013-02-04 NOTE — Telephone Encounter (Signed)
Preadmission screen  

## 2013-02-04 NOTE — Progress Notes (Signed)
P = 84   Consult w/Dr. Erin Fulling- pt to return for NST on 11/3, bid kick count instructions and cared given. Pt to return for sx of labor or decr. FM.  Pt voiced understanding.

## 2013-02-04 NOTE — Progress Notes (Signed)
NST reviewed and reactive.  Amiyah Shryock L. Harraway-Smith, M.D., FACOG    

## 2013-02-07 ENCOUNTER — Ambulatory Visit (INDEPENDENT_AMBULATORY_CARE_PROVIDER_SITE_OTHER): Payer: Medicaid Other | Admitting: *Deleted

## 2013-02-07 VITALS — BP 123/72

## 2013-02-07 DIAGNOSIS — O480 Post-term pregnancy: Secondary | ICD-10-CM

## 2013-02-07 NOTE — Progress Notes (Signed)
102

## 2013-02-09 ENCOUNTER — Inpatient Hospital Stay (HOSPITAL_COMMUNITY)
Admission: RE | Admit: 2013-02-09 | Discharge: 2013-02-13 | DRG: 766 | Disposition: A | Discharge status: Home / Self Care | Payer: Medicaid Other | Source: Ambulatory Visit | Attending: Obstetrics & Gynecology | Admitting: Obstetrics & Gynecology

## 2013-02-09 ENCOUNTER — Encounter (HOSPITAL_COMMUNITY): Payer: Self-pay

## 2013-02-09 VITALS — BP 112/61 | HR 88 | Temp 98.2°F | Resp 18 | Ht 61.0 in | Wt 169.0 lb

## 2013-02-09 DIAGNOSIS — B951 Streptococcus, group B, as the cause of diseases classified elsewhere: Secondary | ICD-10-CM

## 2013-02-09 DIAGNOSIS — Z22330 Carrier of Group B streptococcus: Secondary | ICD-10-CM

## 2013-02-09 DIAGNOSIS — O99892 Other specified diseases and conditions complicating childbirth: Secondary | ICD-10-CM | POA: Diagnosis present

## 2013-02-09 DIAGNOSIS — Z86718 Personal history of other venous thrombosis and embolism: Secondary | ICD-10-CM

## 2013-02-09 DIAGNOSIS — Z98891 History of uterine scar from previous surgery: Secondary | ICD-10-CM

## 2013-02-09 DIAGNOSIS — IMO0002 Reserved for concepts with insufficient information to code with codable children: Secondary | ICD-10-CM

## 2013-02-09 DIAGNOSIS — O480 Post-term pregnancy: Secondary | ICD-10-CM | POA: Diagnosis present

## 2013-02-09 DIAGNOSIS — Z3403 Encounter for supervision of normal first pregnancy, third trimester: Secondary | ICD-10-CM

## 2013-02-09 LAB — CBC
MCHC: 35.1 g/dL (ref 30.0–36.0)
MCV: 84.3 fL (ref 78.0–100.0)
Platelets: 249 10*3/uL (ref 150–400)
RBC: 3.82 MIL/uL — ABNORMAL LOW (ref 3.87–5.11)
WBC: 15.1 10*3/uL — ABNORMAL HIGH (ref 4.0–10.5)

## 2013-02-09 LAB — RPR: RPR Ser Ql: NONREACTIVE

## 2013-02-09 MED ORDER — TERBUTALINE SULFATE 1 MG/ML IJ SOLN: 0.2500 mg | Freq: Once | INTRAMUSCULAR | Status: AC | PRN

## 2013-02-09 MED ORDER — PENICILLIN G POTASSIUM 5000000 UNITS IJ SOLR
5.0000 10*6.[IU] | Freq: Once | INTRAVENOUS | Status: AC
  Administered 2013-02-09: 5 10*6.[IU] via INTRAVENOUS
  Filled 2013-02-09: qty 5

## 2013-02-09 MED ORDER — FENTANYL CITRATE 0.05 MG/ML IJ SOLN
100.0000 ug | INTRAMUSCULAR | Status: DC | PRN
  Administered 2013-02-09 – 2013-02-10 (×4): 100 ug via INTRAVENOUS
  Filled 2013-02-09 (×5): qty 2

## 2013-02-09 MED ORDER — OXYTOCIN 40 UNITS IN LACTATED RINGERS INFUSION - SIMPLE MED: 62.5000 mL/h | INTRAVENOUS | Status: DC

## 2013-02-09 MED ORDER — FLEET ENEMA 7-19 GM/118ML RE ENEM: 1.0000 | ENEMA | RECTAL | Status: DC | PRN

## 2013-02-09 MED ORDER — OXYCODONE-ACETAMINOPHEN 5-325 MG PO TABS: 1.0000 | ORAL_TABLET | ORAL | Status: DC | PRN

## 2013-02-09 MED ORDER — MISOPROSTOL 25 MCG QUARTER TABLET
25.0000 ug | ORAL_TABLET | ORAL | Status: DC | PRN
  Administered 2013-02-09 (×3): 25 ug via VAGINAL
  Filled 2013-02-09 (×3): qty 0.25

## 2013-02-09 MED ORDER — OXYTOCIN BOLUS FROM INFUSION: 500.0000 mL | INTRAVENOUS | Status: DC

## 2013-02-09 MED ORDER — ONDANSETRON HCL 4 MG/2ML IJ SOLN: 4.0000 mg | Freq: Four times a day (QID) | INTRAMUSCULAR | Status: DC | PRN

## 2013-02-09 MED ORDER — CITRIC ACID-SODIUM CITRATE 334-500 MG/5ML PO SOLN
30.0000 mL | ORAL | Status: DC | PRN
  Administered 2013-02-11 (×2): 30 mL via ORAL
  Filled 2013-02-09: qty 15

## 2013-02-09 MED ORDER — PENICILLIN G POTASSIUM 5000000 UNITS IJ SOLR
2.5000 10*6.[IU] | INTRAMUSCULAR | Status: DC
  Administered 2013-02-09 – 2013-02-11 (×11): 2.5 10*6.[IU] via INTRAVENOUS
  Filled 2013-02-09 (×14): qty 2.5

## 2013-02-09 MED ORDER — ACETAMINOPHEN 325 MG PO TABS: 650.0000 mg | ORAL_TABLET | ORAL | Status: DC | PRN

## 2013-02-09 MED ORDER — LACTATED RINGERS IV SOLN: 500.0000 mL | INTRAVENOUS | Status: DC | PRN

## 2013-02-09 MED ORDER — LIDOCAINE HCL (PF) 1 % IJ SOLN: 30.0000 mL | INTRAMUSCULAR | Status: DC | PRN

## 2013-02-09 MED ORDER — IBUPROFEN 600 MG PO TABS: 600.0000 mg | ORAL_TABLET | Freq: Four times a day (QID) | ORAL | Status: DC | PRN

## 2013-02-09 MED ORDER — LACTATED RINGERS IV SOLN
INTRAVENOUS | Status: DC
  Administered 2013-02-09 – 2013-02-10 (×3): via INTRAVENOUS
  Administered 2013-02-10: 125 mL/h via INTRAVENOUS
  Administered 2013-02-11: 07:00:00 via INTRAVENOUS

## 2013-02-09 MED ORDER — ALBUTEROL SULFATE HFA 108 (90 BASE) MCG/ACT IN AERS
2.0000 | INHALATION_SPRAY | Freq: Four times a day (QID) | RESPIRATORY_TRACT | Status: DC | PRN
  Filled 2013-02-09: qty 6.7
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
## 2013-02-09 NOTE — Progress Notes (Signed)
[name] is a 22 y.o. G2P0010 at [redacted]w[redacted]d admitted for IOL 2/2 post dates  Subjective: Sleeping quietly in bed with partner No complaints at this time, trying to get some rest  Objective: BP 120/74  Pulse 94  Temp(Src) 98 F (36.7 C) (Oral)  Resp 18  Ht 5\' 1"  (1.549 m)  Wt 76.658 kg (169 lb)  BMI 31.95 kg/m2  LMP 05/05/2012      FHT:  FHR: 135 bpm, variability: moderate,  accelerations:  Present,  decelerations:  Absent UC:   every2-4 SVE:   Dilation: 1.5 Effacement (%): 30 Station: -2 Exam by:: dr Michail Jewels  Labs: Lab Results  Component Value Date   WBC 15.1* 02/09/2013   HGB 11.3* 02/09/2013   HCT 32.2* 02/09/2013   MCV 84.3 02/09/2013   PLT 249 02/09/2013    Assessment / Plan: IOL 2/2 postdates, doing well S/p 3 cytotec placements  Labor: Early stages of labor, plan for foley bulb with next check if unchanged Preeclampsia:  n/a Fetal Wellbeing:  Category I Pain Control:  Labor support without medications I/D:  n/a Anticipated MOD:  NSVD  [name] 02/09/2013, 6:07 PM

## 2013-02-09 NOTE — Progress Notes (Signed)
cytotec placed by dr Michail Jewels

## 2013-02-09 NOTE — Progress Notes (Addendum)
LABOR PROGRESS NOTE  [name] is a 22 y.o. G2P0010 at [redacted]w[redacted]d  admitted for induction of labor due to Post dates. Due date 02/01/2013.  Subjective: Endorses lower back back and abdominal cramping every 5-10 mins. Otherwise no complaints.   Objective: BP 120/79  Pulse 79  Temp(Src) 98.6 F (37 C) (Oral)  Resp 20  Ht 5\' 1"  (1.549 m)  Wt 76.658 kg (169 lb)  BMI 31.95 kg/m2  LMP 05/05/2012    FHT:  FHR: 120 bpm, variability: moderate,  accelerations:  Present,  decelerations:  Absent UC:   q2-4 mins SVE:   Dilation: 1 Effacement (%): 70 Station: -3 Exam by:: Dr. Imperial Sink   Labs: Lab Results  Component Value Date   WBC 15.1* 02/09/2013   HGB 11.3* 02/09/2013   HCT 32.2* 02/09/2013   MCV 84.3 02/09/2013   PLT 249 02/09/2013    Assessment / Plan: IOL due to dates, s/p cytotec x 3 for cervical ripening.   Labor: Foley bulb placed. Will likely start Pitocin once it falls out.  GBS: Positive. Continue Penicillin per protocol.  Fetal Wellbeing:  Category I Pain Control:  Labor support without medications, eventually desires epidural Anticipated MOD:  NSVD  Hal Neer, MD 02/09/2013, 9:46 PM

## 2013-02-09 NOTE — H&P (Signed)

## 2013-02-09 NOTE — Progress Notes (Signed)
Dr Michail Jewels called - pt's uc's every 2-3 min - unable to place cytotec at present - will attempt to get uc's to trace better for placement of cytotec

## 2013-02-10 ENCOUNTER — Encounter (HOSPITAL_COMMUNITY): Payer: Self-pay

## 2013-02-10 ENCOUNTER — Encounter (HOSPITAL_COMMUNITY): Payer: Medicaid Other | Admitting: Anesthesiology

## 2013-02-10 ENCOUNTER — Inpatient Hospital Stay (HOSPITAL_COMMUNITY): Payer: Medicaid Other | Admitting: Anesthesiology

## 2013-02-10 MED ORDER — TERBUTALINE SULFATE 1 MG/ML IJ SOLN: 0.2500 mg | Freq: Once | INTRAMUSCULAR | Status: AC | PRN

## 2013-02-10 MED ORDER — EPHEDRINE 5 MG/ML INJ
10.0000 mg | INTRAVENOUS | Status: DC | PRN
  Filled 2013-02-10: qty 4

## 2013-02-10 MED ORDER — PHENYLEPHRINE 40 MCG/ML (10ML) SYRINGE FOR IV PUSH (FOR BLOOD PRESSURE SUPPORT): 80.0000 ug | PREFILLED_SYRINGE | INTRAVENOUS | Status: DC | PRN

## 2013-02-10 MED ORDER — LACTATED RINGERS IV SOLN
500.0000 mL | Freq: Once | INTRAVENOUS | Status: AC
  Administered 2013-02-10: 500 mL via INTRAVENOUS

## 2013-02-10 MED ORDER — DIPHENHYDRAMINE HCL 50 MG/ML IJ SOLN: 12.5000 mg | INTRAMUSCULAR | Status: DC | PRN

## 2013-02-10 MED ORDER — FENTANYL 2.5 MCG/ML BUPIVACAINE 1/10 % EPIDURAL INFUSION (WH - ANES)
INTRAMUSCULAR | Status: DC | PRN
  Administered 2013-02-10: 14 mL/h via EPIDURAL

## 2013-02-10 MED ORDER — OXYTOCIN 40 UNITS IN LACTATED RINGERS INFUSION - SIMPLE MED
1.0000 m[IU]/min | INTRAVENOUS | Status: DC
  Administered 2013-02-10: 2 m[IU]/min via INTRAVENOUS
  Filled 2013-02-10: qty 1000

## 2013-02-10 MED ORDER — PHENYLEPHRINE 40 MCG/ML (10ML) SYRINGE FOR IV PUSH (FOR BLOOD PRESSURE SUPPORT)
80.0000 ug | PREFILLED_SYRINGE | INTRAVENOUS | Status: DC | PRN
  Administered 2013-02-11: 40 ug via INTRAVENOUS
  Filled 2013-02-10: qty 10

## 2013-02-10 MED ORDER — EPHEDRINE 5 MG/ML INJ: 10.0000 mg | INTRAVENOUS | Status: DC | PRN

## 2013-02-10 MED ORDER — LIDOCAINE HCL (PF) 1 % IJ SOLN
INTRAMUSCULAR | Status: DC | PRN
  Administered 2013-02-10 (×2): 9 mL

## 2013-02-10 MED ORDER — FENTANYL 2.5 MCG/ML BUPIVACAINE 1/10 % EPIDURAL INFUSION (WH - ANES)
14.0000 mL/h | INTRAMUSCULAR | Status: DC | PRN
  Administered 2013-02-11: 14 mL/h via EPIDURAL
  Filled 2013-02-10 (×3): qty 125

## 2013-02-10 NOTE — Progress Notes (Signed)
LABOR PROGRESS NOTE  [name] is a 22 y.o. G2P0010 at [redacted]w[redacted]d  admitted for induction of labor due to Post dates. Due date 02/01/2013.  Subjective: S/p epidural placement, feeling comfortable in bed Friends and partner at bedside Improved contraction pattern s/p starting pit  Objective: BP 117/70  Pulse 97  Temp(Src) 98.8 F (37.1 C) (Oral)  Resp 18  Ht 5\' 1"  (1.549 m)  Wt 169 lb (76.658 kg)  BMI 31.95 kg/m2  SpO2 98%  LMP 05/05/2012    FHT:  130s mod var post accles, no decels UC:   q1-3 Dilation: 6.5 Effacement (%): 80 Cervical Position: Middle Station: -1 Presentation: Vertex Exam by:: dr. Michail Jewels  Labs: Lab Results  Component Value Date   WBC 15.1* 02/09/2013   HGB 11.3* 02/09/2013   HCT 32.2* 02/09/2013   MCV 84.3 02/09/2013   PLT 249 02/09/2013    Assessment / Plan: IOL due to dates, s/p cytotec x 3 for cervical ripening.  S/p Foley bulb placement and appropriate dilation Now s/p AROM after improved contraction pattern on pit  Labor: active stages of labor, on pit 6mu GBS: Positive. Continue Penicillin per protocol.  Fetal Wellbeing:  Category I Pain Control:  Epidural Anticipated MOD:  NSVD  [name], MD 02/10/2013, 6:12 PM

## 2013-02-10 NOTE — Progress Notes (Signed)
LABOR PROGRESS NOTE  [name] is a 22 y.o. G2P0010 at [redacted]w[redacted]d  admitted for induction of labor due to Post dates. Due date 02/01/2013.  Subjective: Up and ambulating in the halls, using a bosu ball to help with pelvic pressure Feeling increased suprapubic discomfort  Objective: BP 107/67  Pulse 72  Temp(Src) 99 F (37.2 C) (Oral)  Resp 18  Ht 5\' 1"  (1.549 m)  Wt 76.658 kg (169 lb)  BMI 31.95 kg/m2  LMP 05/05/2012    FHT:  Was off monitor for ambulation, will reassess now that pt is back in room UC:   q2-4 mins SVE: foley bulb in place, subjectively feels like 3 around foley, 70%  Labs: Lab Results  Component Value Date   WBC 15.1* 02/09/2013   HGB 11.3* 02/09/2013   HCT 32.2* 02/09/2013   MCV 84.3 02/09/2013   PLT 249 02/09/2013    Assessment / Plan: IOL due to dates, s/p cytotec x 3 for cervical ripening.  S/p Foley bulb placement  Labor: Foley bulb still in place. Will likely start Pitocin once it falls out.  GBS: Positive. Continue Penicillin per protocol.  Fetal Wellbeing:  Category I Pain Control:  Labor support without medications, eventually desires epidural Anticipated MOD:  NSVD  [name], MD 02/10/2013, 9:36 AM

## 2013-02-10 NOTE — Progress Notes (Signed)
[name] is a 22 y.o. G2P0010 at [redacted]w[redacted]d    Subjective: Patient experiencing pressure, but tolerating contractions.  Objective: BP 93/60  Pulse 73  Temp(Src) 98.2 F (36.8 C) (Oral)  Resp 18  Ht 5\' 1"  (1.549 m)  Wt 76.658 kg (169 lb)  BMI 31.95 kg/m2  SpO2 98%  LMP 05/05/2012      FHT:  FHR: 130 bpm, variability: moderate,  accelerations:  Present,  decelerations:  Absent UC:   irregular, every 2-4 minutes SVE:   Dilation: 6 Effacement (%): 100 Station: 0 Exam by:: Drenda Freeze CNM  SVE done by SNM and CNM 5.5/90/-1  Labs: Lab Results  Component Value Date   WBC 15.1* 02/09/2013   HGB 11.3* 02/09/2013   HCT 32.2* 02/09/2013   MCV 84.3 02/09/2013   PLT 249 02/09/2013    Assessment / Plan: IUP @ 41/2 weeks IOL for postdates  Insert IUPC to monitor contractions Continue to increase pitocin until adequate contractions Anticipate NSVD  [name] 02/10/2013, 8:41 PM   I have seen and examined this patient and agree the above assessment. [name],[name] 02/10/2013 10:20 PM

## 2013-02-10 NOTE — Anesthesia Procedure Notes (Signed)
Epidural Patient location during procedure: OB Start time: 02/10/2013 4:34 PM End time: 02/10/2013 4:38 PM  Staffing Anesthesiologist: Leilani Able Performed by: anesthesiologist   Preanesthetic Checklist Completed: patient identified, surgical consent, pre-op evaluation, timeout performed, IV checked, risks and benefits discussed and monitors and equipment checked  Epidural Patient position: sitting Prep: site prepped and draped and DuraPrep Patient monitoring: continuous pulse ox and blood pressure Approach: midline Injection technique: LOR air  Needle:  Needle type: Tuohy  Needle gauge: 17 G Needle length: 9 cm and 9 Needle insertion depth: 5 cm cm Catheter type: closed end flexible Catheter size: 19 Gauge Catheter at skin depth: 10 cm Test dose: negative and Other  Assessment Sensory level: T9 Events: blood not aspirated, injection not painful, no injection resistance, negative IV test and no paresthesia  Additional Notes Reason for block:procedure for pain

## 2013-02-10 NOTE — Progress Notes (Signed)
LABOR PROGRESS NOTE  [name] is a 22 y.o. G2P0010 at [redacted]w[redacted]d  admitted for induction of labor due to Post dates. Due date 02/01/2013.  Subjective: FB out, ambulating feeling painful ctx  Objective: BP 111/72  Pulse 76  Temp(Src) 98.7 F (37.1 C) (Oral)  Resp 18  Ht 5\' 1"  (1.549 m)  Wt 76.658 kg (169 lb)  BMI 31.95 kg/m2  LMP 05/05/2012    FHT:  130s mod var mult accles, no recent decels UC:   q2-4 irreguar Dilation: 5 Effacement (%): 70 Cervical Position: Middle Station: -1 Presentation: Vertex Exam by:: felkel,rn  Labs: Lab Results  Component Value Date   WBC 15.1* 02/09/2013   HGB 11.3* 02/09/2013   HCT 32.2* 02/09/2013   MCV 84.3 02/09/2013   PLT 249 02/09/2013    Assessment / Plan: IOL due to dates, s/p cytotec x 3 for cervical ripening.  S/p Foley bulb placement, monitor and consider pit  Labor: consider Pit if not making adquate ctx  GBS: Positive. Continue Penicillin per protocol.  Fetal Wellbeing:  Category I Pain Control:  Labor support without medications, eventually desires epidural Anticipated MOD:  NSVD  Relena Ivancic, Audie Clear, MD 02/10/2013, 3:04 PM

## 2013-02-10 NOTE — Anesthesia Preprocedure Evaluation (Addendum)
Anesthesia Evaluation  Patient identified by MRN, date of birth, ID band Patient awake    Reviewed: Allergy & Precautions, H&P , NPO status , Patient's Chart, lab work & pertinent test results  Airway Mallampati: II TM Distance: >3 FB Neck ROM: full    Dental no notable dental hx.    Pulmonary asthma ,    Pulmonary exam normal       Cardiovascular     Neuro/Psych negative neurological ROS  negative psych ROS   GI/Hepatic negative GI ROS, Neg liver ROS,   Endo/Other  negative endocrine ROS  Renal/GU negative Renal ROS     Musculoskeletal negative musculoskeletal ROS (+)   Abdominal Normal abdominal exam  (+)   Peds  Hematology negative hematology ROS (+)   Anesthesia Other Findings   Reproductive/Obstetrics (+) Pregnancy (failure to progress --> C/S)                          Anesthesia Physical Anesthesia Plan  ASA: II  Anesthesia Plan: Epidural   Post-op Pain Management:    Induction:   Airway Management Planned:   Additional Equipment:   Intra-op Plan:   Post-operative Plan:   Informed Consent: I have reviewed the patients History and Physical, chart, labs and discussed the procedure including the risks, benefits and alternatives for the proposed anesthesia with the patient or authorized representative who has indicated his/her understanding and acceptance.     Plan Discussed with:   Anesthesia Plan Comments:         Anesthesia Quick Evaluation

## 2013-02-10 NOTE — Progress Notes (Signed)
Encouraged pt to get up and walk around, change position, labor ball, shower to help with back pain

## 2013-02-11 ENCOUNTER — Encounter (HOSPITAL_COMMUNITY): Payer: Self-pay

## 2013-02-11 ENCOUNTER — Encounter (HOSPITAL_COMMUNITY)
Admission: RE | Disposition: A | Discharge status: Home / Self Care | Payer: Self-pay | Source: Ambulatory Visit | Attending: Obstetrics & Gynecology

## 2013-02-11 DIAGNOSIS — Z86718 Personal history of other venous thrombosis and embolism: Secondary | ICD-10-CM

## 2013-02-11 DIAGNOSIS — O9989 Other specified diseases and conditions complicating pregnancy, childbirth and the puerperium: Secondary | ICD-10-CM

## 2013-02-11 DIAGNOSIS — O480 Post-term pregnancy: Secondary | ICD-10-CM

## 2013-02-11 SURGERY — Surgical Case
Anesthesia: Epidural | Site: Abdomen | Wound class: Clean Contaminated

## 2013-02-11 MED ORDER — LIDOCAINE-EPINEPHRINE (PF) 2 %-1:200000 IJ SOLN
INTRAMUSCULAR | Status: AC
  Filled 2013-02-11: qty 20

## 2013-02-11 MED ORDER — ONDANSETRON HCL 4 MG/2ML IJ SOLN: 4.0000 mg | Freq: Three times a day (TID) | INTRAMUSCULAR | Status: DC | PRN

## 2013-02-11 MED ORDER — FENTANYL CITRATE 0.05 MG/ML IJ SOLN
INTRAMUSCULAR | Status: AC
  Filled 2013-02-11: qty 2

## 2013-02-11 MED ORDER — DIPHENHYDRAMINE HCL 50 MG/ML IJ SOLN: 12.5000 mg | INTRAMUSCULAR | Status: DC | PRN

## 2013-02-11 MED ORDER — SODIUM BICARBONATE 8.4 % IV SOLN
INTRAVENOUS | Status: AC
  Filled 2013-02-11: qty 50

## 2013-02-11 MED ORDER — MORPHINE SULFATE (PF) 0.5 MG/ML IJ SOLN
INTRAMUSCULAR | Status: DC | PRN
  Administered 2013-02-11: 3 mg via EPIDURAL

## 2013-02-11 MED ORDER — SODIUM CHLORIDE 0.9 % IJ SOLN: 3.0000 mL | INTRAMUSCULAR | Status: DC | PRN

## 2013-02-11 MED ORDER — SENNOSIDES-DOCUSATE SODIUM 8.6-50 MG PO TABS
2.0000 | ORAL_TABLET | ORAL | Status: DC
  Administered 2013-02-11 – 2013-02-12 (×2): 2 via ORAL
  Filled 2013-02-11 (×4): qty 2

## 2013-02-11 MED ORDER — DIPHENHYDRAMINE HCL 25 MG PO CAPS
25.0000 mg | ORAL_CAPSULE | Freq: Four times a day (QID) | ORAL | Status: DC | PRN
  Administered 2013-02-12: 25 mg via ORAL
  Filled 2013-02-11 (×2): qty 1

## 2013-02-11 MED ORDER — BUPIVACAINE HCL (PF) 0.25 % IJ SOLN
INTRAMUSCULAR | Status: DC | PRN
  Administered 2013-02-11 (×4): 5 mL via EPIDURAL

## 2013-02-11 MED ORDER — KETOROLAC TROMETHAMINE 30 MG/ML IJ SOLN: 30.0000 mg | Freq: Four times a day (QID) | INTRAMUSCULAR | Status: AC | PRN

## 2013-02-11 MED ORDER — NALBUPHINE SYRINGE 5 MG/0.5 ML
5.0000 mg | INJECTION | INTRAMUSCULAR | Status: DC | PRN
  Administered 2013-02-11: 10 mg via SUBCUTANEOUS
  Administered 2013-02-11: 5 mg via SUBCUTANEOUS
  Filled 2013-02-11 (×2): qty 1

## 2013-02-11 MED ORDER — TETANUS-DIPHTH-ACELL PERTUSSIS 5-2.5-18.5 LF-MCG/0.5 IM SUSP
0.5000 mL | Freq: Once | INTRAMUSCULAR | Status: DC
  Filled 2013-02-11: qty 0.5

## 2013-02-11 MED ORDER — MENTHOL 3 MG MT LOZG
1.0000 | LOZENGE | OROMUCOSAL | Status: DC | PRN
  Filled 2013-02-11: qty 9

## 2013-02-11 MED ORDER — FENTANYL CITRATE 0.05 MG/ML IJ SOLN
100.0000 ug | Freq: Once | INTRAMUSCULAR | Status: AC
  Administered 2013-02-11: 100 ug via EPIDURAL

## 2013-02-11 MED ORDER — METOCLOPRAMIDE HCL 5 MG/ML IJ SOLN: 10.0000 mg | Freq: Three times a day (TID) | INTRAMUSCULAR | Status: DC | PRN

## 2013-02-11 MED ORDER — SIMETHICONE 80 MG PO CHEW
80.0000 mg | CHEWABLE_TABLET | ORAL | Status: DC | PRN
  Administered 2013-02-12: 80 mg via ORAL
  Filled 2013-02-11: qty 1

## 2013-02-11 MED ORDER — ONDANSETRON HCL 4 MG/2ML IJ SOLN
INTRAMUSCULAR | Status: AC
  Filled 2013-02-11: qty 2

## 2013-02-11 MED ORDER — NALBUPHINE SYRINGE 5 MG/0.5 ML
5.0000 mg | INJECTION | INTRAMUSCULAR | Status: DC | PRN
  Filled 2013-02-11 (×3): qty 1

## 2013-02-11 MED ORDER — KETOROLAC TROMETHAMINE 60 MG/2ML IM SOLN
60.0000 mg | Freq: Once | INTRAMUSCULAR | Status: AC | PRN
  Administered 2013-02-11: 60 mg via INTRAMUSCULAR

## 2013-02-11 MED ORDER — SIMETHICONE 80 MG PO CHEW
80.0000 mg | CHEWABLE_TABLET | ORAL | Status: DC
  Administered 2013-02-11: 80 mg via ORAL
  Filled 2013-02-11 (×4): qty 1

## 2013-02-11 MED ORDER — MORPHINE SULFATE 0.5 MG/ML IJ SOLN
INTRAMUSCULAR | Status: AC
  Filled 2013-02-11: qty 10

## 2013-02-11 MED ORDER — FENTANYL CITRATE 0.05 MG/ML IJ SOLN: 25.0000 ug | INTRAMUSCULAR | Status: DC | PRN

## 2013-02-11 MED ORDER — OXYTOCIN 40 UNITS IN LACTATED RINGERS INFUSION - SIMPLE MED: 62.5000 mL/h | INTRAVENOUS | Status: AC

## 2013-02-11 MED ORDER — ONDANSETRON HCL 4 MG/2ML IJ SOLN
INTRAMUSCULAR | Status: DC | PRN
  Administered 2013-02-11: 4 mg via INTRAVENOUS

## 2013-02-11 MED ORDER — LANOLIN HYDROUS EX OINT: 1.0000 "application " | TOPICAL_OINTMENT | CUTANEOUS | Status: DC | PRN

## 2013-02-11 MED ORDER — LACTATED RINGERS IV SOLN
INTRAVENOUS | Status: DC
  Administered 2013-02-11: 12:00:00 via INTRAVENOUS

## 2013-02-11 MED ORDER — NALOXONE HCL 1 MG/ML IJ SOLN
1.0000 ug/kg/h | INTRAVENOUS | Status: DC | PRN
  Filled 2013-02-11: qty 2

## 2013-02-11 MED ORDER — PHENYLEPHRINE HCL 10 MG/ML IJ SOLN
INTRAMUSCULAR | Status: AC
  Filled 2013-02-11: qty 1

## 2013-02-11 MED ORDER — DIPHENHYDRAMINE HCL 25 MG PO CAPS
25.0000 mg | ORAL_CAPSULE | ORAL | Status: DC | PRN
  Filled 2013-02-11: qty 1

## 2013-02-11 MED ORDER — OXYTOCIN 10 UNIT/ML IJ SOLN
40.0000 [IU] | INTRAVENOUS | Status: DC | PRN
  Administered 2013-02-11: 40 [IU] via INTRAVENOUS

## 2013-02-11 MED ORDER — MORPHINE SULFATE (PF) 0.5 MG/ML IJ SOLN
INTRAMUSCULAR | Status: DC | PRN
  Administered 2013-02-11: 2 mg via INTRAVENOUS

## 2013-02-11 MED ORDER — SCOPOLAMINE 1 MG/3DAYS TD PT72
1.0000 | MEDICATED_PATCH | Freq: Once | TRANSDERMAL | Status: DC
  Administered 2013-02-11: 1.5 mg via TRANSDERMAL

## 2013-02-11 MED ORDER — OXYCODONE-ACETAMINOPHEN 5-325 MG PO TABS
1.0000 | ORAL_TABLET | ORAL | Status: DC | PRN
  Administered 2013-02-12 – 2013-02-13 (×5): 1 via ORAL
  Filled 2013-02-11 (×5): qty 1

## 2013-02-11 MED ORDER — ONDANSETRON HCL 4 MG PO TABS: 4.0000 mg | ORAL_TABLET | ORAL | Status: DC | PRN

## 2013-02-11 MED ORDER — DIBUCAINE 1 % RE OINT
1.0000 "application " | TOPICAL_OINTMENT | RECTAL | Status: DC | PRN
  Filled 2013-02-11: qty 28

## 2013-02-11 MED ORDER — PNEUMOCOCCAL VAC POLYVALENT 25 MCG/0.5ML IJ INJ
0.5000 mL | INJECTION | INTRAMUSCULAR | Status: AC
  Administered 2013-02-12: 0.5 mL via INTRAMUSCULAR
  Filled 2013-02-11: qty 0.5

## 2013-02-11 MED ORDER — NALOXONE HCL 0.4 MG/ML IJ SOLN: 0.4000 mg | INTRAMUSCULAR | Status: DC | PRN

## 2013-02-11 MED ORDER — DIPHENHYDRAMINE HCL 50 MG/ML IJ SOLN: 25.0000 mg | INTRAMUSCULAR | Status: DC | PRN

## 2013-02-11 MED ORDER — ONDANSETRON HCL 4 MG/2ML IJ SOLN: 4.0000 mg | INTRAMUSCULAR | Status: DC | PRN

## 2013-02-11 MED ORDER — MEPERIDINE HCL 25 MG/ML IJ SOLN: 6.2500 mg | INTRAMUSCULAR | Status: DC | PRN

## 2013-02-11 MED ORDER — PRENATAL MULTIVITAMIN CH
1.0000 | ORAL_TABLET | Freq: Every day | ORAL | Status: DC
  Administered 2013-02-11 – 2013-02-13 (×3): 1 via ORAL
  Filled 2013-02-11 (×5): qty 1

## 2013-02-11 MED ORDER — OXYTOCIN 10 UNIT/ML IJ SOLN
INTRAMUSCULAR | Status: AC
  Filled 2013-02-11: qty 4

## 2013-02-11 MED ORDER — FENTANYL CITRATE 0.05 MG/ML IJ SOLN
100.0000 ug | Freq: Once | INTRAMUSCULAR | Status: AC
  Administered 2013-02-11 (×2): 100 ug via EPIDURAL

## 2013-02-11 MED ORDER — CEFAZOLIN SODIUM-DEXTROSE 2-3 GM-% IV SOLR
2.0000 g | INTRAVENOUS | Status: AC
  Administered 2013-02-11: 2 g via INTRAVENOUS
  Filled 2013-02-11: qty 50

## 2013-02-11 MED ORDER — IBUPROFEN 600 MG PO TABS
600.0000 mg | ORAL_TABLET | Freq: Four times a day (QID) | ORAL | Status: DC
  Administered 2013-02-11 – 2013-02-13 (×8): 600 mg via ORAL
  Filled 2013-02-11 (×8): qty 1

## 2013-02-11 MED ORDER — WITCH HAZEL-GLYCERIN EX PADS: 1.0000 "application " | MEDICATED_PAD | CUTANEOUS | Status: DC | PRN

## 2013-02-11 MED ORDER — SODIUM BICARBONATE 8.4 % IV SOLN
INTRAVENOUS | Status: DC | PRN
  Administered 2013-02-11 (×4): 5 mL via EPIDURAL

## 2013-02-11 MED ORDER — SCOPOLAMINE 1 MG/3DAYS TD PT72
MEDICATED_PATCH | TRANSDERMAL | Status: AC
  Filled 2013-02-11: qty 1

## 2013-02-11 MED ORDER — KETOROLAC TROMETHAMINE 60 MG/2ML IM SOLN
INTRAMUSCULAR | Status: AC
  Administered 2013-02-11: 60 mg via INTRAMUSCULAR
  Filled 2013-02-11: qty 2

## 2013-02-11 MED ORDER — SIMETHICONE 80 MG PO CHEW
80.0000 mg | CHEWABLE_TABLET | Freq: Three times a day (TID) | ORAL | Status: DC
  Administered 2013-02-11 – 2013-02-13 (×6): 80 mg via ORAL
  Filled 2013-02-11 (×12): qty 1

## 2013-02-11 SURGICAL SUPPLY — 32 items
BRR ADH 6X5 SEPRAFILM 1 SHT (MISCELLANEOUS)
CLAMP CORD UMBIL (MISCELLANEOUS) IMPLANT
CONTAINER PREFILL 10% NBF 15ML (MISCELLANEOUS) IMPLANT
DRAPE LG THREE QUARTER DISP (DRAPES) ×4 IMPLANT
DRESSING TELFA 8X3 (GAUZE/BANDAGES/DRESSINGS) ×2 IMPLANT
DRSG OPSITE POSTOP 4X10 (GAUZE/BANDAGES/DRESSINGS) ×2 IMPLANT
DURAPREP 26ML APPLICATOR (WOUND CARE) ×2 IMPLANT
ELECT REM PT RETURN 9FT ADLT (ELECTROSURGICAL) ×2
ELECTRODE REM PT RTRN 9FT ADLT (ELECTROSURGICAL) ×1 IMPLANT
EXTRACTOR VACUUM M CUP 4 TUBE (SUCTIONS) IMPLANT
GAUZE SPONGE 4X4 12PLY STRL LF (GAUZE/BANDAGES/DRESSINGS) ×2 IMPLANT
GLOVE BIOGEL PI IND STRL 6.5 (GLOVE) ×1 IMPLANT
GLOVE BIOGEL PI INDICATOR 6.5 (GLOVE) ×1
GLOVE SURG SS PI 6.0 STRL IVOR (GLOVE) ×2 IMPLANT
GOWN PREVENTION PLUS XLARGE (GOWN DISPOSABLE) ×4 IMPLANT
GOWN STRL REIN XL XLG (GOWN DISPOSABLE) ×4 IMPLANT
KIT ABG SYR 3ML LUER SLIP (SYRINGE) IMPLANT
NDL HYPO 25X5/8 SAFETYGLIDE (NEEDLE) IMPLANT
NEEDLE HYPO 25X5/8 SAFETYGLIDE (NEEDLE) IMPLANT
NS IRRIG 1000ML POUR BTL (IV SOLUTION) ×2 IMPLANT
PACK C SECTION WH (CUSTOM PROCEDURE TRAY) ×2 IMPLANT
PAD ABD 7.5X8 STRL (GAUZE/BANDAGES/DRESSINGS) ×1 IMPLANT
PAD OB MATERNITY 4.3X12.25 (PERSONAL CARE ITEMS) ×2 IMPLANT
RTRCTR C-SECT PINK 25CM LRG (MISCELLANEOUS) IMPLANT
SEPRAFILM MEMBRANE 5X6 (MISCELLANEOUS) IMPLANT
STAPLER VISISTAT 35W (STAPLE) IMPLANT
SUT PLAIN 0 NONE (SUTURE) IMPLANT
SUT VIC AB 0 CT1 36 (SUTURE) ×8 IMPLANT
SUT VIC AB 4-0 KS 27 (SUTURE) ×2 IMPLANT
TOWEL OR 17X24 6PK STRL BLUE (TOWEL DISPOSABLE) ×2 IMPLANT
TRAY FOLEY CATH 14FR (SET/KITS/TRAYS/PACK) ×2 IMPLANT
WATER STERILE IRR 1000ML POUR (IV SOLUTION) ×2 IMPLANT

## 2013-02-11 NOTE — Transfer of Care (Signed)
Immediate Anesthesia Transfer of Care Note  Patient: [name]  Procedure(s) Performed: Procedure(s): CESAREAN SECTION (N/A)  Patient Location: PACU  Anesthesia Type:Epidural  Level of Consciousness: awake, alert  and oriented  Airway & Oxygen Therapy: Patient Spontanous Breathing and Patient connected to nasal cannula oxygen  Post-op Assessment: Report given to PACU RN and Post -op Vital signs reviewed and stable  Post vital signs: Reviewed and stable  Complications: No apparent anesthesia complications

## 2013-02-11 NOTE — Progress Notes (Signed)
   [name] is a 22 y.o. G2P0010 at [redacted]w[redacted]d  admitted for induction of labor due to Post dates.   Subjective: Pt feels pressure in pelvic area and buttocks area.  Objective: BP 116/66  Pulse 81  Temp(Src) 98.1 F (36.7 C) (Oral)  Resp 18  Ht 5\' 1"  (1.549 m)  Wt 76.658 kg (169 lb)  BMI 31.95 kg/m2  SpO2 98%  LMP 05/05/2012    FHT:  FHR: 140 bpm, variability: moderate,  accelerations:  Present,  decelerations:  Absent; occ periods of decreased variability, but + scalp stimulation UC:   regular, every 1-2 minutes; MVU's 200; has had 45-60 minutes of coupling/inadequate contractions in the previous hour or two SVE:   Dilation: 6 Effacement (%): 60 Station: +1 Exam by:: Inetta Fermo CNM  Pitocin @ 8 mu/min  Labs: Lab Results  Component Value Date   WBC 15.1* 02/09/2013   HGB 11.3* 02/09/2013   HCT 32.2* 02/09/2013   MCV 84.3 02/09/2013   PLT 249 02/09/2013    Assessment / Plan: Arrest in active phase of labor  Continue to monitor FHR Increase pitocin until adequate contractions   Labor: No dilation change, + descent Fetal Wellbeing:  Category II Pain Control:  Epidural Anticipated MOD:  NSVD I/D: GBS +, PCN given  Selena Lesser 02/11/2013, 3:23 AM

## 2013-02-11 NOTE — Progress Notes (Signed)
Patient ID: [name], female   DOB: 06-05-1990, 22 y.o.   MRN: [id number]  Spoke with midwife about  FHR.  Beat to beat is still good and scalp stimulation is a positive sign.  At this point MVUs show adequate contractions.  Continue to monitor the contractions and if they become inadequate again, increase pitocin per protocol.  Selena Lesser Student midwife

## 2013-02-11 NOTE — Op Note (Signed)
[name] PROCEDURE DATE: 02/11/2013  PREOPERATIVE DIAGNOSIS: Intrauterine pregnancy at  [redacted]w[redacted]d weeks gestation; failure to progress: arrest of dilation  POSTOPERATIVE DIAGNOSIS: The same  PROCEDURE:     Cesarean Section  SURGEON:  Dr. Catalina Antigua  ASSISTANT: none  INDICATIONS: [name] is a 22 y.o. G2P0010 at [redacted]w[redacted]d scheduled for cesarean section secondary to failure to progress: arrest of dilation. Patient has been 6 cm for greater than 6 hours with adequate contractions. Patient is very frustrated, exhausted and wishes to proceed with cesarean section.  The risks of cesarean section discussed with the patient included but were not limited to: bleeding which may require transfusion or reoperation; infection which may require antibiotics; injury to bowel, bladder, ureters or other surrounding organs; injury to the fetus; need for additional procedures including hysterectomy in the event of a life-threatening hemorrhage; placental abnormalities wth subsequent pregnancies, incisional problems, thromboembolic phenomenon and other postoperative/anesthesia complications. The patient concurred with the proposed plan, giving informed written consent for the procedure.    FINDINGS:  Viable female infant in cephalic presentation.  Apgars 9 and 9.  Clear amniotic fluid.  Intact placenta, three vessel cord.  Normal uterus, fallopian tubes and ovaries bilaterally.  ANESTHESIA:    Spinal INTRAVENOUS FLUIDS:1700 ml ESTIMATED BLOOD LOSS: 500 ml URINE OUTPUT:  100 ml SPECIMENS: Placenta sent to L&D COMPLICATIONS: None immediate  PROCEDURE IN DETAIL:  The patient received intravenous antibiotics and had sequential compression devices applied to her lower extremities while in the preoperative area.  She was then taken to the operating room where anesthesia was induced and was found to be adequate. A foley catheter was placed into her bladder and attached to [name] gravity. She was then placed in a dorsal  supine position with a leftward tilt, and prepped and draped in a sterile manner. After an adequate timeout was performed, a Pfannenstiel skin incision was made with scalpel and carried through to the underlying layer of fascia. The fascia was incised in the midline and this incision was extended bilaterally using the Mayo scissors. Kocher clamps were applied to the superior aspect of the fascial incision and the underlying rectus muscles were dissected off bluntly. A similar process was carried out on the inferior aspect of the facial incision. The rectus muscles were separated in the midline bluntly and the peritoneum was entered bluntly. The Alexis self-retaining retractor was introduced into the abdominal cavity. Attention was turned to the lower uterine segment where a bladder flap was created, and a transverse hysterotomy was made with a scalpel and extended bilaterally bluntly. The infant was successfully delivered, and cord was clamped and cut and infant was handed over to awaiting neonatology team. Uterine massage was then administered and the placenta delivered intact with three-vessel cord. The uterus was cleared of clot and debris.  The hysterotomy was closed with 0 Vicryl in a running locked fashion, and an imbricating layer was also placed with a 0 Vicryl. Overall, excellent hemostasis was noted. The pelvis copiously irrigated and cleared of all clot and debris. Hemostasis was confirmed on all surfaces.  The peritoneum and the muscles were reapproximated using 0 vicryl interrupted stitches. The fascia was then closed using 0 Vicryl in a running locked fashion.  The subcutaneous layer was reapproximated with plain gut and the skin was closed in a subcuticular fashion using 3.0 Vicryl. The patient tolerated the procedure well. Sponge, lap, instrument and needle counts were correct x 2. She was taken to the recovery room in stable condition.  [name],PEGGYMD  02/11/2013 7:59 AM

## 2013-02-11 NOTE — Anesthesia Postprocedure Evaluation (Signed)
Anesthesia Post Note  Patient: [name]  Procedure(s) Performed: Procedure(s) (LRB): CESAREAN SECTION (N/A)  Anesthesia type: Epidural  Patient location: PACU  Post pain: Pain level controlled  Post assessment: Post-op Vital signs reviewed  Last Vitals:  Filed Vitals:   02/11/13 0830  BP: 98/49  Pulse: 102  Temp:   Resp: 20    Post vital signs: Reviewed  Level of consciousness: awake  Complications: No apparent anesthesia complications

## 2013-02-11 NOTE — OR Nursing (Signed)
Placenta to OR Refrigerator. 

## 2013-02-11 NOTE — Progress Notes (Signed)
Dr. Jolayne Panther in to examine pt/strip. Cx 6/thick ant lip/+2.  FHR  Cat 1.  Labor is adequate.  Pt has become extremely uncomfortable over the last half hour and anesthesia is working to solve that.  Pt does not want a c section at this time.

## 2013-02-11 NOTE — Anesthesia Postprocedure Evaluation (Signed)
  Anesthesia Post-op Note  Patient: [name]  Procedure(s) Performed: Procedure(s): CESAREAN SECTION (N/A)  Patient Location: PACU and Mother/Baby  Anesthesia Type:Epidural  Level of Consciousness: awake, alert , oriented and patient cooperative  Airway and Oxygen Therapy: Patient Spontanous Breathing  Post-op Pain: none  Post-op Assessment: Post-op Vital signs reviewed, Patient's Cardiovascular Status Stable and Respiratory Function Stable  Post-op Vital Signs: Reviewed and stable  Complications: No apparent anesthesia complications

## 2013-02-11 NOTE — Progress Notes (Signed)
   [name] is a 22 y.o. G2P0010 at [redacted]w[redacted]d  admitted for induction of labor due to Post dates. .  Subjective:  Feeling pain with contractions despite 3 PCA boluses Objective: BP 121/64  Pulse 81  Temp(Src) 98.4 F (36.9 C) (Oral)  Resp 18  Ht 5\' 1"  (1.549 m)  Wt 76.658 kg (169 lb)  BMI 31.95 kg/m2  SpO2 98%  LMP 05/05/2012    FHT:  FHR: 140 bpm, variability: moderate,  accelerations:  Present,  decelerations:  Absent UC:   regular, every 1-2 minutes; MVU's 200 SVE:   Dilation: 8 Effacement (%): 100 Station: +1 Exam by:: Auriel RN  Pitocin @ 8 mu/min  Labs: Lab Results  Component Value Date   WBC 15.1* 02/09/2013   HGB 11.3* 02/09/2013   HCT 32.2* 02/09/2013   MCV 84.3 02/09/2013   PLT 249 02/09/2013    Assessment / Plan: Induction of labor due to postterm,  progressing well on pitocin CRNA to come bolus epidural Labor: Progressing normally Fetal Wellbeing:  Category I Pain Control:  Epidural Anticipated MOD:  NSVD  CRESENZO-DISHMAN,Kanika Bungert 02/11/2013, 12:23 AM

## 2013-02-11 NOTE — Progress Notes (Signed)
Patient ID: [name], female   DOB: [birth year], 23 y.o.   MRN: [id number]  Patient requested to see MD regarding risks of cesarean section. Patient is tired, uncomfortable, and frustrated at lack of progression  FHT: baseline 135, mod variability, + accels, no decels Toco: contractions q 5 minutes SVE: 6/ thick anterior lip/+2  A/P 22 yo admitted for induction of labor secondary to postdate - Fetal status category I - No cervical change made for greater than 6 hours - Risks, benefits and alternatives to cesarean section were reviewed and explained, including but not limited to risks of bleeding, infection and damage to adjacent organs. All questions were answered. Patient verbalized understanding. Patient wishes to proceed with cesarean section

## 2013-02-11 NOTE — Addendum Note (Signed)
Addendum created 02/11/13 0909 by Cristela Blue, MD   Modules edited: Anesthesia Attestations

## 2013-02-11 NOTE — Progress Notes (Signed)
Patient ID: [name], female   DOB: 01/17/1991, 22 y.o.   MRN: [id number] Patient uncomfortable with contractions, pushing involuntarily. Frustrated with different cervical exams. Patient reports contractions in lower back  FHR: baseline 120, moderate variability.+accels, no decels Toco: q2 min 200 MVU Cervical exam: 6/thick anterior lip/+2  A/P 22 yo G2P0010 at [redacted]w[redacted]d here for postdate induction of labor - Tracing category I - Given pain location and protracted labor- suspect fetus to be in OP position - Anesthesia to assist in making patient more comfortable - Patient is not interested in having a cesarean section at this time - Continue induction of labor with pitocin

## 2013-02-11 NOTE — Progress Notes (Signed)
UR chart review completed.  

## 2013-02-11 NOTE — Anesthesia Postprocedure Evaluation (Signed)
  Anesthesia Post-op Note  Patient: [name]  Procedure(s) Performed: Procedure(s): CESAREAN SECTION (N/A)  Patient is awake, responsive, moving her legs, and has signs of resolution of her numbness. Pain and nausea are reasonably well controlled. Vital signs are stable and clinically acceptable. Oxygen saturation is clinically acceptable. There are no apparent anesthetic complications at this time. Patient is ready for discharge.

## 2013-02-12 LAB — CBC
Hemoglobin: 8.4 g/dL — ABNORMAL LOW (ref 12.0–15.0)
MCHC: 34.3 g/dL (ref 30.0–36.0)
Platelets: 162 10*3/uL (ref 150–400)
RBC: 2.88 MIL/uL — ABNORMAL LOW (ref 3.87–5.11)
WBC: 15.6 10*3/uL — ABNORMAL HIGH (ref 4.0–10.5)

## 2013-02-12 MED ORDER — FERROUS SULFATE 325 (65 FE) MG PO TABS
325.0000 mg | ORAL_TABLET | Freq: Every day | ORAL | Status: DC
  Administered 2013-02-13: 325 mg via ORAL
  Filled 2013-02-12: qty 1

## 2013-02-12 NOTE — Lactation Note (Signed)
This note was copied from the chart of [name]. Lactation Consultation Note Called to room for follow up visit, RN reports baby wont latch to left breast.  Mom complains of sore nipples.  Mom reports baby breastfeeds well on right breast but only sucks once and comes off left breast.  She has tried many positions and now right nipple is sore.  Comfort gels given and instructed on use and cleaning. Assisted in football hold on left breast.  Moms breast are large and pendulous, baby has difficulty holding on.  Baby is showing mild cues and very sleepy.  Waking techniques demonstrated.  Allowed breast to hand in natural position with out mom holding breast.  Minimal compression to the breast allowed baby to quickly latch with wide flanged lips and rhythmic suckling with a few swallows heard.  Mom reports no pain with this feeding.  Encouraged mom to try side lying for next feeding.  Report given to [facility] RN, Sheralyn Boatman.  Patient Name: Darlene Tanya Crothers [id number] Date: 02/12/2013 Reason for consult: Follow-up assessment;Difficult latch;Breast/nipple pain   Maternal Data    Feeding Feeding Type: Breast Fed Length of feed:  (5 minutes plus)  LATCH Score/Interventions Latch: Grasps breast easily, tongue down, lips flanged, rhythmical sucking. Intervention(s): Breast massage;Breast compression;Assist with latch;Adjust position  Audible Swallowing: A few with stimulation Intervention(s): Hand expression;Skin to skin Intervention(s): Skin to skin  Type of Nipple: Everted at rest and after stimulation Intervention(s): No intervention needed  Comfort (Breast/Nipple): Filling, red/small blisters or bruises, mild/mod discomfort  Problem noted: Mild/Moderate discomfort Interventions (Mild/moderate discomfort): Hand expression  Hold (Positioning): Assistance needed to correctly position infant at breast and maintain latch.  LATCH Score: 7  Lactation Tools Discussed/Used Tools: Comfort  gels   Consult Status Consult Status: Follow-up Date: 02/13/13 Follow-up type: In-patient    Bralynn Donado, Arvella Merles 02/12/2013, 11:35 PM

## 2013-02-12 NOTE — Anesthesia Postprocedure Evaluation (Signed)
  Anesthesia Post-op Note  Patient: [name]  Procedure(s) Performed: Procedure(s): CESAREAN SECTION (N/A)  Patient Location: Mother/Baby  Anesthesia Type:Epidural  Level of Consciousness: awake, alert  and oriented  Airway and Oxygen Therapy: Patient Spontanous Breathing  Post-op Pain: none  Post-op Assessment: Post-op Vital signs reviewed, Patient's Cardiovascular Status Stable, No headache, No backache, No residual numbness and No residual motor weakness  Post-op Vital Signs: Reviewed and stable  Complications: No apparent anesthesia complications

## 2013-02-12 NOTE — Lactation Note (Signed)
This note was copied from the chart of [name]. Lactation Consultation Note: Initial visit with mom- she is getting ready to latch baby when I went into room. Reports that nipples are sore. Assisted mom with deeper latch and she reports that feels better. Encouraged to rub EBM into nipples after nursing. BF brochure given to parents. Discussed BFSG and OP appointments as resources for support after DC. Discussed cluster feeding. Parents asking about formula. Encouraged frequent BF to promote a good milk supply They have Similac with them. Encouraged to call RN for assist with supplement if they decide to. No further questions at present. To call for assist prn  Patient Name: [name] [id number] Date: 02/12/2013 Reason for consult: Initial assessment   Maternal Data Formula Feeding for Exclusion: No Infant to breast within first hour of birth: Yes Does the patient have breastfeeding experience prior to this delivery?: No  Feeding Feeding Type: Breast Fed  LATCH Score/Interventions Latch: Grasps breast easily, tongue down, lips flanged, rhythmical sucking.  Audible Swallowing: A few with stimulation  Type of Nipple: Everted at rest and after stimulation  Comfort (Breast/Nipple): Filling, red/small blisters or bruises, mild/mod discomfort  Problem noted: Mild/Moderate discomfort Interventions (Mild/moderate discomfort): Hand expression  Hold (Positioning): Assistance needed to correctly position infant at breast and maintain latch. Intervention(s): Breastfeeding basics reviewed;Support Pillows;Position options  LATCH Score: 7  Lactation Tools Discussed/Used     Consult Status Consult Status: Follow-up Date: 02/13/13 Follow-up type: In-patient    Pamelia Hoit 02/12/2013, 1:45 PM

## 2013-02-12 NOTE — Progress Notes (Signed)
Subjective: Postpartum Day 1: Cesarean Delivery  Patient reports incisional pain, tolerating PO, + BM and no problems voiding. Minimal pain at this point while resting, only with deep palpation of the abdomen, no acute complaints     Objective: Vital signs in last 24 hours: Temp:  [97.1 F (36.2 C)-99 F (37.2 C)] 98.2 F (36.8 C) (11/08 0409) Pulse Rate:  [60-107] 76 (11/08 0409) Resp:  [16-22] 16 (11/08 0409) BP: (98-124)/(37-75) 103/69 mmHg (11/08 0409) SpO2:  [96 %-99 %] 97 % (11/08 0013)  Physical Exam:  General: alert, cooperative and no distress Lochia: appropriate Uterine Fundus: firm Incision: healing well, no significant drainage, no significant erythema DVT Evaluation: No evidence of DVT seen on physical exam. No significant calf/ankle edema. Mild bilat foot edema   Recent Labs  02/09/13 0745 02/12/13 0628  HGB 11.3* 8.4*  HCT 32.2* 24.5*    Assessment/Plan: Status post Cesarean section for failure to progress/arrest of dilation. Doing well postoperatively.  Continue current care. Breast feeding, feels that this is going well Unsure of contraception plans at this time Will f/up in [facility] in 4-6wks postop  [name] 02/12/2013, 7:32 AM  I have seen and examined this patient and agree with above documentation in the resident's note. Incision still covered with pressure dressing and not removed this AM so will evaluate incision tomorrow. No bleeding through pressure dressing. Start iron for low hgb once daily.   Rulon Abide, M.D. [facility] Fellow 02/12/2013 11:31 AM

## 2013-02-13 DIAGNOSIS — Z98891 History of uterine scar from previous surgery: Secondary | ICD-10-CM

## 2013-02-13 MED ORDER — OXYCODONE-ACETAMINOPHEN 5-325 MG PO TABS: 1.0000 | ORAL_TABLET | ORAL | Status: DC | PRN

## 2013-02-13 MED ORDER — DOCUSATE SODIUM 100 MG PO CAPS: 100.0000 mg | ORAL_CAPSULE | Freq: Two times a day (BID) | ORAL | Status: DC | PRN

## 2013-02-13 MED ORDER — IBUPROFEN 600 MG PO TABS: 600.0000 mg | ORAL_TABLET | Freq: Four times a day (QID) | ORAL | Status: DC

## 2013-02-13 MED ORDER — MEASLES, MUMPS & RUBELLA VAC ~~LOC~~ INJ
0.5000 mL | INJECTION | Freq: Once | SUBCUTANEOUS | Status: AC
  Administered 2013-02-13: 0.5 mL via SUBCUTANEOUS
  Filled 2013-02-13: qty 0.5

## 2013-02-13 NOTE — Discharge Summary (Signed)
Attestation of Attending Supervision of Advanced Practitioner (CNM/NP): Evaluation and management procedures were performed by the Advanced Practitioner under my supervision and collaboration. I have reviewed the Advanced Practitioner's note and chart, and I agree with the management and plan.  Nasiya Pascual H. 5:46 PM   

## 2013-02-13 NOTE — Discharge Summary (Signed)
Obstetric Discharge Summary Reason for Admission: onset of labor Prenatal Procedures: ultrasound Intrapartum Procedures: cesarean: low cervical, transverse Postpartum Procedures: none Complications-Operative and Postpartum: none Hemoglobin  Date Value Range Status  02/12/2013 8.4* 12.0 - 15.0 g/dL Final     HCT  Date Value Range Status  02/12/2013 24.5* 36.0 - 46.0 % Final    Physical Exam:  General: alert, cooperative and no distress Lochia: appropriate Uterine Fundus: firm Incision: no significant drainage DVT Evaluation: No evidence of DVT seen on physical exam. Negative Homan's sign. No cords or calf tenderness. No significant calf/ankle edema.  Discharge Diagnoses: Term Pregnancy-delivered Operative Note: INDICATIONS: [name] is a 22 y.o. G2P0010 at [redacted]w[redacted]d scheduled for cesarean section secondary to failure to progress: arrest of dilation. Patient has been 6 cm for greater than 6 hours with adequate contractions. FINDINGS: Viable female infant in cephalic presentation. Apgars 9 and 9. Clear amniotic fluid. Intact placenta, three vessel cord. Normal uterus, fallopian tubes and ovaries bilaterally.  ANESTHESIA: Spinal  INTRAVENOUS FLUIDS:1700 ml  ESTIMATED BLOOD LOSS: 500 ml  URINE OUTPUT: 100 ml  SPECIMENS: Placenta sent to L&D  COMPLICATIONS: None immediate   Discharge Information: Date: 02/13/2013 Activity: pelvic rest Diet: routine Medications: Ibuprofen, Colace and Percocet Condition: stable Instructions: refer to practice specific booklet Discharge to: home Mirena for contraception  Newborn Data: Live born female  Birth Weight: 7 lb 1.9 oz (3230 g) APGAR: 9, 9  Home with mother.  [name] 02/13/2013, 10:54 AM

## 2013-02-13 NOTE — Lactation Note (Signed)
This note was copied from the chart of [name]. Lactation Consultation Note: Follow up visit before DC. Mom reports that the baby fed "constantly" from 1-6 am. Now asleep on mom's chest. Reports that the comfort gels are helping her nipples a lot. Reports that her breasts ae feeling a little heavier this morning. No questions at present. Reviewed BFSG and OP appointments as Resources for support after DC. To call prn  Patient Name: Darlene Twylia Oka [id number] Date: 02/13/2013 Reason for consult: Follow-up assessment   Maternal Data Formula Feeding for Exclusion: No  Feeding Feeding Type: Breast Fed Length of feed: 50 min  LATCH Score/Interventions Latch: Grasps breast easily, tongue down, lips flanged, rhythmical sucking. Intervention(s): Breast massage  Audible Swallowing: A few with stimulation Intervention(s): Hand expression Intervention(s): Skin to skin  Type of Nipple: Everted at rest and after stimulation Intervention(s): No intervention needed  Comfort (Breast/Nipple): Filling, red/small blisters or bruises, mild/mod discomfort  Problem noted: Mild/Moderate discomfort Interventions (Mild/moderate discomfort): Hand expression  Hold (Positioning): Assistance needed to correctly position infant at breast and maintain latch. Intervention(s): Breastfeeding basics reviewed;Support Pillows;Position options;Skin to skin  LATCH Score: 7  Lactation Tools Discussed/Used     Consult Status Consult Status: Complete    Pamelia Hoit 02/13/2013, 8:05 AM

## 2013-02-14 ENCOUNTER — Encounter (HOSPITAL_COMMUNITY): Payer: Self-pay | Admitting: Obstetrics and Gynecology

## 2013-02-24 ENCOUNTER — Telehealth: Payer: Self-pay

## 2013-02-24 MED ORDER — OXYCODONE-ACETAMINOPHEN 5-325 MG PO TABS: 1.0000 | ORAL_TABLET | ORAL | Status: DC | PRN

## 2013-02-24 NOTE — Telephone Encounter (Signed)
Pt called and stated that she deliveried on the 7th and ran out of pain medication could she get a refill.  Per Dr. Erin Fulling pt can have a refill of her percocet #15 with no refills.  Called pt and pt mother stated that was not with her.  I informed pt mother if she could please tell her that the medication refill she requested that provider refilled but she will need to come to the office to pick medication and bring ID.  Pt mother stated ok no further questions.

## 2013-02-28 ENCOUNTER — Telehealth: Payer: Self-pay | Admitting: *Deleted

## 2013-02-28 NOTE — Telephone Encounter (Signed)
Patient called inquiring where the medicine was sent to. Pt was looking for her oxycodone. Rx is at front desk for patient to pick up. Pt stated okay and hung up the phone.

## 2013-03-10 ENCOUNTER — Encounter: Payer: Self-pay | Admitting: *Deleted

## 2013-03-23 NOTE — Progress Notes (Signed)
NST reviewed and reactive.  

## 2013-03-28 ENCOUNTER — Ambulatory Visit: Payer: Medicaid Other | Admitting: Family Medicine

## 2013-07-05 ENCOUNTER — Encounter (HOSPITAL_COMMUNITY): Payer: Self-pay | Admitting: Emergency Medicine

## 2013-07-05 ENCOUNTER — Emergency Department (INDEPENDENT_AMBULATORY_CARE_PROVIDER_SITE_OTHER)
Admission: EM | Admit: 2013-07-05 | Discharge: 2013-07-05 | Disposition: A | Discharge status: Home / Self Care | Payer: Medicaid Other | Source: Home / Self Care | Attending: Emergency Medicine | Admitting: Emergency Medicine

## 2013-07-05 DIAGNOSIS — J209 Acute bronchitis, unspecified: Secondary | ICD-10-CM

## 2013-07-05 MED ORDER — ALBUTEROL SULFATE HFA 108 (90 BASE) MCG/ACT IN AERS: 2.0000 | INHALATION_SPRAY | RESPIRATORY_TRACT | Status: DC | PRN

## 2013-07-05 MED ORDER — TRAMADOL HCL 50 MG PO TABS: ORAL_TABLET | ORAL | Status: DC

## 2013-07-05 MED ORDER — PREDNISONE 20 MG PO TABS: 20.0000 mg | ORAL_TABLET | Freq: Two times a day (BID) | ORAL | Status: DC

## 2013-07-05 MED ORDER — AZITHROMYCIN 250 MG PO TABS: ORAL_TABLET | ORAL | Status: DC

## 2013-07-05 NOTE — ED Provider Notes (Signed)
Chief Complaint   Chief Complaint  Patient presents with  . Cough  . URI    History of Present Illness   [name] is a 23 year old female who has had a one-week history of cough productive of white sputum, chest tightness, wheezing, nasal congestion with green drainage, headache, and sinus pressure. She had some fever at the onset of the illness but none recently. She's also had some posttussive vomiting and sore throat. She denies any other GI symptoms. No chest pain or ear congestion. She has a history of asthma and uses albuterol on an as-needed basis.  Review of Systems   Other than as noted above, the patient denies any of the following symptoms: Systemic:  No fevers, chills, sweats, or myalgias. Eye:  No redness or discharge. ENT:  No ear pain, headache, nasal congestion, drainage, sinus pressure, or sore throat. Neck:  No neck pain, stiffness, or swollen glands. Lungs:  No cough, sputum production, hemoptysis, wheezing, chest tightness, shortness of breath or chest pain. GI:  No abdominal pain, nausea, vomiting or diarrhea.  PMFSH   Past medical history, family history, social history, meds, and allergies were reviewed.   Physical exam   Vital signs:  BP 117/66  Pulse 87  Temp(Src) 98.7 F (37.1 C) (Oral)  Resp 20  SpO2 99%  LMP 05/10/2013 General:  Alert and oriented.  In no distress.  Skin warm and dry. Eye:  No conjunctival injection or drainage. Lids were normal. ENT:  TMs and canals were normal, without erythema or inflammation.  Nasal mucosa was clear and uncongested, without drainage.  Mucous membranes were moist.  Pharynx was clear with no exudate or drainage.  There were no oral ulcerations or lesions. Neck:  Supple, no adenopathy, tenderness or mass. Lungs:  No respiratory distress.  Lungs were clear to auscultation, without wheezes, rales or rhonchi.  Breath sounds were clear and equal bilaterally.  Heart:  Regular rhythm, without gallops, murmers or  rubs. Skin:  Clear, warm, and dry, without rash or lesions.  Assessment     The encounter diagnosis was Acute bronchitis.  Plan    1.  Meds:  The following meds were prescribed:   Discharge Medication List as of 07/05/2013  1:47 PM    START taking these medications   Details  !! albuterol (PROVENTIL HFA;VENTOLIN HFA) 108 (90 BASE) MCG/ACT inhaler Inhale 2 puffs into the lungs every 4 (four) hours as needed for wheezing or shortness of breath., Starting 07/05/2013, Until Discontinued, Normal    azithromycin (ZITHROMAX Z-PAK) 250 MG tablet Take as directed., Normal    predniSONE (DELTASONE) 20 MG tablet Take 1 tablet (20 mg total) by mouth 2 (two) times daily., Starting 07/05/2013, Until Discontinued, Normal    traMADol (ULTRAM) 50 MG tablet 1 to 2 tablets every 8 hours as needed for cough, Normal     !! - Potential duplicate medications found. Please discuss with provider.      2.  Patient Education/Counseling:  The patient was given appropriate handouts, self care instructions, and instructed in symptomatic relief.  Instructed to get extra fluids, rest, and use a cool mist vaporizer.    3.  Follow up:  The patient was told to follow up here if no better in 3 to 4 days, or sooner if becoming worse in any way, and given some red flag symptoms such as increasing fever, difficulty breathing, chest pain, or persistent vomiting which would prompt immediate return.  Follow up here as needed.  Reuben Likesavid C Mohammad Granade, MD 07/05/13 [phone]

## 2013-07-05 NOTE — ED Notes (Signed)
Cough and congestion that started 06/07/13.  Reports multiple family members with similar symptoms in the home.

## 2013-07-05 NOTE — Discharge Instructions (Signed)
Acute Bronchitis Bronchitis is inflammation of the airways that extend from the windpipe into the lungs (bronchi). The inflammation often causes mucus to develop. This leads to a cough, which is the most common symptom of bronchitis.  In acute bronchitis, the condition usually develops suddenly and goes away over time, usually in a couple weeks. Smoking, allergies, and asthma can make bronchitis worse. Repeated episodes of bronchitis may cause further lung problems.  CAUSES Acute bronchitis is most often caused by the same virus that causes a cold. The virus can spread from person to person (contagious).  SIGNS AND SYMPTOMS   Cough.   Fever.   Coughing up mucus.   Body aches.   Chest congestion.   Chills.   Shortness of breath.   Sore throat.  DIAGNOSIS  Acute bronchitis is usually diagnosed through a physical exam. Tests, such as chest X-rays, are sometimes done to rule out other conditions.  TREATMENT  Acute bronchitis usually goes away in a couple weeks. Often times, no medical treatment is necessary. Medicines are sometimes given for relief of fever or cough. Antibiotics are usually not needed but may be prescribed in certain situations. In some cases, an inhaler may be recommended to help reduce shortness of breath and control the cough. A cool mist vaporizer may also be used to help thin bronchial secretions and make it easier to clear the chest.  HOME CARE INSTRUCTIONS  Get plenty of rest.   Drink enough fluids to keep your urine clear or pale yellow (unless you have a medical condition that requires fluid restriction). Increasing fluids may help thin your secretions and will prevent dehydration.   Only take over-the-counter or prescription medicines as directed by your health care provider.   Avoid smoking and secondhand smoke. Exposure to cigarette smoke or irritating chemicals will make bronchitis worse. If you are a smoker, consider using nicotine gum or skin  patches to help control withdrawal symptoms. Quitting smoking will help your lungs heal faster.   Reduce the chances of another bout of acute bronchitis by washing your hands frequently, avoiding people with cold symptoms, and trying not to touch your hands to your mouth, nose, or eyes.   Follow up with your health care provider as directed.  SEEK MEDICAL CARE IF: Your symptoms do not improve after 1 week of treatment.  SEEK IMMEDIATE MEDICAL CARE IF:  You develop an increased fever or chills.   You have chest pain.   You have severe shortness of breath.  You have bloody sputum.   You develop dehydration.  You develop fainting.  You develop repeated vomiting.  You develop a severe headache. MAKE SURE YOU:   Understand these instructions.  Will watch your condition.  Will get help right away if you are not doing well or get worse. Document Released: 05/01/2004 Document Revised: 11/24/2012 Document Reviewed: 09/14/2012 ExitCare Patient Information 2014 [employer].  

## 2013-07-19 ENCOUNTER — Encounter (HOSPITAL_COMMUNITY): Payer: Self-pay | Admitting: Emergency Medicine

## 2013-07-19 ENCOUNTER — Emergency Department (HOSPITAL_COMMUNITY)
Admission: EM | Admit: 2013-07-19 | Discharge: 2013-07-19 | Disposition: A | Discharge status: Home / Self Care | Payer: Medicaid Other | Source: Home / Self Care | Attending: Family Medicine | Admitting: Family Medicine

## 2013-07-19 DIAGNOSIS — R197 Diarrhea, unspecified: Secondary | ICD-10-CM

## 2013-07-19 DIAGNOSIS — Z3490 Encounter for supervision of normal pregnancy, unspecified, unspecified trimester: Secondary | ICD-10-CM

## 2013-07-19 DIAGNOSIS — Z3201 Encounter for pregnancy test, result positive: Secondary | ICD-10-CM

## 2013-07-19 LAB — POCT PREGNANCY, URINE: Preg Test, Ur: POSITIVE — AB

## 2013-07-19 NOTE — Discharge Instructions (Signed)
Thank you for coming in today. Please followup at [facility].  Eat live active yogurt.  Come back as needed.  If your belly pain worsens, or you have high fever, bad vomiting, blood in your stool or black tarry stool go to the Emergency Room.  Please start taking prenatal vitamins daily. Diarrhea Diarrhea is frequent loose and watery bowel movements. It can cause you to feel weak and dehydrated. Dehydration can cause you to become tired and thirsty, have a dry mouth, and have decreased urination that often is dark yellow. Diarrhea is a sign of another problem, most often an infection that will not last long. In most cases, diarrhea typically lasts 2 3 days. However, it can last longer if it is a sign of something more serious. It is important to treat your diarrhea as directed by your caregive to lessen or prevent future episodes of diarrhea. CAUSES  Some common causes include:  Gastrointestinal infections caused by viruses, bacteria, or parasites.  Food poisoning or food allergies.  Certain medicines, such as antibiotics, chemotherapy, and laxatives.  Artificial sweeteners and fructose.  Digestive disorders. HOME CARE INSTRUCTIONS  Ensure adequate fluid intake (hydration): have 1 cup (8 oz) of fluid for each diarrhea episode. Avoid fluids that contain simple sugars or sports drinks, fruit juices, whole milk products, and sodas. Your urine should be clear or pale yellow if you are drinking enough fluids. Hydrate with an oral rehydration solution that you can purchase at pharmacies, retail stores, and online. You can prepare an oral rehydration solution at home by mixing the following ingredients together:    tsp table salt.   tsp baking soda.   tsp salt substitute containing potassium chloride.  1  tablespoons sugar.  1 L (34 oz) of water.  Certain foods and beverages may increase the speed at which food moves through the gastrointestinal (GI) tract. These foods  and beverages should be avoided and include:  Caffeinated and alcoholic beverages.  High-fiber foods, such as raw fruits and vegetables, nuts, seeds, and whole grain breads and cereals.  Foods and beverages sweetened with sugar alcohols, such as xylitol, sorbitol, and mannitol.  Some foods may be well tolerated and may help thicken stool including:  Starchy foods, such as rice, toast, pasta, low-sugar cereal, oatmeal, grits, baked potatoes, crackers, and bagels.  Bananas.  Applesauce.  Add probiotic-rich foods to help increase healthy bacteria in the GI tract, such as yogurt and fermented milk products.  Wash your hands well after each diarrhea episode.  Only take over-the-counter or prescription medicines as directed by your caregiver.  Take a warm bath to relieve any burning or pain from frequent diarrhea episodes. SEEK IMMEDIATE MEDICAL CARE IF:   You are unable to keep fluids down.  You have persistent vomiting.  You have blood in your stool, or your stools are black and tarry.  You do not urinate in 6 8 hours, or there is only a small amount of very dark urine.  You have abdominal pain that increases or localizes.  You have weakness, dizziness, confusion, or lightheadedness.  You have a severe headache.  Your diarrhea gets worse or does not get better.  You have a fever or persistent symptoms for more than 2 3 days.  You have a fever and your symptoms suddenly get worse. MAKE SURE YOU:   Understand these instructions.  Will watch your condition.  Will get help right away if you are not doing well or get worse. Document Released:  03/14/2002 Document Revised: 03/10/2012 Document Reviewed: 11/30/2011 [facility] Patient Information 2014 [place], [place].

## 2013-07-19 NOTE — ED Notes (Signed)
Requesting pregnancy test, reports multiple home preg tests-all positive.  Also concerned about medications she is taking-will need to stop this medicine.
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
## 2013-07-22 ENCOUNTER — Ambulatory Visit (INDEPENDENT_AMBULATORY_CARE_PROVIDER_SITE_OTHER): Payer: Medicaid Other | Admitting: *Deleted

## 2013-07-22 ENCOUNTER — Encounter: Payer: Self-pay | Admitting: *Deleted

## 2013-07-22 VITALS — BP 96/62 | HR 83 | Temp 97.9°F | Ht 61.0 in | Wt 170.6 lb

## 2013-07-22 DIAGNOSIS — Z3201 Encounter for pregnancy test, result positive: Secondary | ICD-10-CM

## 2013-07-22 DIAGNOSIS — Z3200 Encounter for pregnancy test, result unknown: Secondary | ICD-10-CM

## 2013-07-22 DIAGNOSIS — N926 Irregular menstruation, unspecified: Secondary | ICD-10-CM

## 2013-07-22 LAB — POCT PREGNANCY, URINE: Preg Test, Ur: POSITIVE — AB

## 2013-07-22 NOTE — Progress Notes (Signed)
Pregnancy test POSITIVE:  Pt will come to this clinic for care.  Labs/dating ultrasound ordered.  Letter of pregnancy verification given.

## 2013-07-23 LAB — HIV ANTIBODY (ROUTINE TESTING W REFLEX): HIV 1&2 Ab, 4th Generation: NONREACTIVE

## 2013-07-23 LAB — OBSTETRIC PANEL
Antibody Screen: NEGATIVE
Basophils Absolute: 0.1 10*3/uL (ref 0.0–0.1)
Basophils Relative: 1 % (ref 0–1)
EOS ABS: 0.3 10*3/uL (ref 0.0–0.7)
EOS PCT: 3 % (ref 0–5)
HEMATOCRIT: 36.1 % (ref 36.0–46.0)
HEMOGLOBIN: 12.1 g/dL (ref 12.0–15.0)
Hepatitis B Surface Ag: NEGATIVE
LYMPHS ABS: 2.5 10*3/uL (ref 0.7–4.0)
Lymphocytes Relative: 29 % (ref 12–46)
MCH: 28.7 pg (ref 26.0–34.0)
MCHC: 33.5 g/dL (ref 30.0–36.0)
MCV: 85.7 fL (ref 78.0–100.0)
Monocytes Absolute: 0.9 10*3/uL (ref 0.1–1.0)
Monocytes Relative: 11 % (ref 3–12)
Neutro Abs: 4.8 10*3/uL (ref 1.7–7.7)
Neutrophils Relative %: 56 % (ref 43–77)
Platelets: 306 10*3/uL (ref 150–400)
RBC: 4.21 MIL/uL (ref 3.87–5.11)
RDW: 15.2 % (ref 11.5–15.5)
RH TYPE: POSITIVE
Rubella: 1.99 Index — ABNORMAL HIGH (ref <0.90)
WBC: 8.6 10*3/uL (ref 4.0–10.5)

## 2013-07-23 LAB — GC/CHLAMYDIA PROBE AMP
CT PROBE, AMP APTIMA: NEGATIVE
GC Probe RNA: NEGATIVE

## 2013-07-24 LAB — CULTURE, OB URINE
Colony Count: NO GROWTH
ORGANISM ID, BACTERIA: NO GROWTH

## 2013-07-26 LAB — PRESCRIPTION MONITORING PROFILE (19 PANEL)
Amphetamine/Meth: NEGATIVE ng/mL
BUPRENORPHINE, URINE: NEGATIVE ng/mL
Barbiturate Screen, Urine: NEGATIVE ng/mL
Benzodiazepine Screen, Urine: NEGATIVE ng/mL
CARISOPRODOL, URINE: NEGATIVE ng/mL
COCAINE METABOLITES: NEGATIVE ng/mL
Creatinine, Urine: 135.7 mg/dL (ref >20.0)
ECSTASY: NEGATIVE ng/mL
FENTANYL URINE: NEGATIVE ng/mL
MEPERIDINE UR: NEGATIVE ng/mL
METHADONE SCREEN, URINE: NEGATIVE ng/mL
METHAQUALONE SCREEN (URINE): NEGATIVE ng/mL
NITRITES URINE, INITIAL: NEGATIVE ug/mL
Opiate Screen, Urine: NEGATIVE ng/mL
Phencyclidine, Ur: NEGATIVE ng/mL
Propoxyphene: NEGATIVE ng/mL
Tapentadol, urine: NEGATIVE ng/mL
Tramadol Scrn, Ur: NEGATIVE ng/mL
Zolpidem, Urine: NEGATIVE ng/mL
pH, Initial: 6.3 pH (ref 4.5–8.9)

## 2013-07-26 LAB — OXYCODONE, URINE (LC/MS-MS)
Noroxycodone, Ur: 462 ng/mL — AB
OXYMORPHONE, URINE: 74 ng/mL — AB
Oxycodone, ur: 131 ng/mL — AB

## 2013-07-26 LAB — HEMOGLOBINOPATHY EVALUATION
HEMOGLOBIN OTHER: 0 %
HGB A: 97.3 % (ref 96.8–97.8)
Hgb A2 Quant: 2.7 % (ref 2.2–3.2)
Hgb F Quant: 0 % (ref 0.0–2.0)
Hgb S Quant: 0 %

## 2013-07-26 LAB — CANNABANOIDS (GC/LC/MS), URINE: THC-COOH (GC/LC/MS), ur confirm: 844 ng/mL — AB

## 2013-07-28 ENCOUNTER — Encounter (HOSPITAL_COMMUNITY): Payer: Self-pay

## 2013-07-28 ENCOUNTER — Ambulatory Visit (HOSPITAL_COMMUNITY)
Admission: RE | Admit: 2013-07-28 | Discharge: 2013-07-28 | Disposition: A | Discharge status: Home / Self Care | Payer: Medicaid Other | Source: Ambulatory Visit | Attending: Obstetrics & Gynecology | Admitting: Obstetrics & Gynecology

## 2013-07-28 DIAGNOSIS — Z3689 Encounter for other specified antenatal screening: Secondary | ICD-10-CM | POA: Insufficient documentation

## 2013-07-28 DIAGNOSIS — N926 Irregular menstruation, unspecified: Secondary | ICD-10-CM

## 2013-07-28 DIAGNOSIS — O34599 Maternal care for other abnormalities of gravid uterus, unspecified trimester: Secondary | ICD-10-CM | POA: Insufficient documentation

## 2013-07-28 DIAGNOSIS — N831 Corpus luteum cyst of ovary, unspecified side: Secondary | ICD-10-CM | POA: Insufficient documentation

## 2013-08-16 ENCOUNTER — Encounter: Payer: Medicaid Other | Admitting: Obstetrics & Gynecology

## 2013-08-16 ENCOUNTER — Encounter: Payer: Self-pay | Admitting: Obstetrics & Gynecology

## 2013-08-16 ENCOUNTER — Encounter (HOSPITAL_COMMUNITY): Payer: Self-pay

## 2013-08-16 ENCOUNTER — Inpatient Hospital Stay (HOSPITAL_COMMUNITY): Payer: Medicaid Other

## 2013-08-16 ENCOUNTER — Inpatient Hospital Stay (HOSPITAL_COMMUNITY)
Admission: AD | Admit: 2013-08-16 | Discharge: 2013-08-16 | Disposition: A | Discharge status: Home / Self Care | Payer: Medicaid Other | Source: Ambulatory Visit | Attending: Obstetrics & Gynecology | Admitting: Obstetrics & Gynecology

## 2013-08-16 DIAGNOSIS — Z86718 Personal history of other venous thrombosis and embolism: Secondary | ICD-10-CM | POA: Insufficient documentation

## 2013-08-16 DIAGNOSIS — N898 Other specified noninflammatory disorders of vagina: Secondary | ICD-10-CM

## 2013-08-16 DIAGNOSIS — J45909 Unspecified asthma, uncomplicated: Secondary | ICD-10-CM

## 2013-08-16 DIAGNOSIS — O200 Threatened abortion: Secondary | ICD-10-CM

## 2013-08-16 DIAGNOSIS — I82629 Acute embolism and thrombosis of deep veins of unspecified upper extremity: Secondary | ICD-10-CM

## 2013-08-16 DIAGNOSIS — N939 Abnormal uterine and vaginal bleeding, unspecified: Secondary | ICD-10-CM

## 2013-08-16 DIAGNOSIS — O26859 Spotting complicating pregnancy, unspecified trimester: Secondary | ICD-10-CM | POA: Insufficient documentation

## 2013-08-16 DIAGNOSIS — Z87891 Personal history of nicotine dependence: Secondary | ICD-10-CM | POA: Insufficient documentation

## 2013-08-16 DIAGNOSIS — R109 Unspecified abdominal pain: Secondary | ICD-10-CM | POA: Insufficient documentation

## 2013-08-16 LAB — URINALYSIS, ROUTINE W REFLEX MICROSCOPIC
Bilirubin Urine: NEGATIVE
GLUCOSE, UA: NEGATIVE mg/dL
Ketones, ur: 15 mg/dL — AB
LEUKOCYTES UA: NEGATIVE
Nitrite: NEGATIVE
PH: 6 (ref 5.0–8.0)
PROTEIN: NEGATIVE mg/dL
SPECIFIC GRAVITY, URINE: 1.025 (ref 1.005–1.030)
Urobilinogen, UA: 2 mg/dL — ABNORMAL HIGH (ref 0.0–1.0)

## 2013-08-16 LAB — URINE MICROSCOPIC-ADD ON

## 2013-08-16 LAB — WET PREP, GENITAL
CLUE CELLS WET PREP: NONE SEEN
TRICH WET PREP: NONE SEEN
YEAST WET PREP: NONE SEEN

## 2013-08-16 LAB — POCT PREGNANCY, URINE: Preg Test, Ur: POSITIVE — AB

## 2013-08-16 NOTE — Discharge Instructions (Signed)
Threatened Miscarriage  Bleeding during the first 20 weeks of pregnancy is common. This is sometimes called a threatened miscarriage. This is a pregnancy that is threatening to end before the twentieth week of pregnancy. Often this bleeding stops with bed rest or decreased activities as suggested by your caregiver and the pregnancy continues without any more problems. You may be asked to not have sexual intercourse, have orgasms or use tampons until further notice. Sometimes a threatened miscarriage can progress to a complete or incomplete miscarriage. This may or may not require further treatment. Some miscarriages occur before a woman misses a menstrual period and knows she is pregnant.  Miscarriages occur in 15 to 20% of all pregnancies and usually occur during the first 13 weeks of the pregnancy. The exact cause of a miscarriage is usually never known. A miscarriage is natures way of ending a pregnancy that is abnormal or would not make it to term. There are some things that may put you at risk to have a miscarriage, such as:  · Hormone problems.  · Infection of the uterus or cervix.  · Chronic illness, diabetes for example, especially if it is not controlled.  · Abnormal shaped uterus.  · Fibroids in the uterus.  · Incompetent cervix (the cervix is too weak to hold the baby).  · Smoking.  · Drinking too much alcohol. It's best not to drink any alcohol when you are pregnant.  · Taking illegal drugs.  TREATMENT   When a miscarriage becomes complete and all products of conception (all the tissue in the uterus) have been passed, often no treatment is needed. If you think you passed tissue, save it in a container and take it to your doctor for evaluation. If the miscarriage is incomplete (parts of the fetus or placenta remain in the uterus), further treatment may be needed. The most common reason for further treatment is continued bleeding (hemorrhage) because pregnancy tissue did not pass out of the uterus. This  often occurs if a miscarriage is incomplete. Tissue left behind may also become infected. Treatment usually is dilatation and curettage (the removal of the remaining products of pregnancy. This can be done by a simple sucking procedure (suction curettage) or a simple scraping of the inside of the uterus. This may be done in the hospital or in the caregiver's office. This is only done when your caregiver knows that there is no chance for the pregnancy to proceed to term. This is determined by physical examination, negative pregnancy test, falling pregnancy hormone count and/or, an ultrasound revealing a dead fetus.  Miscarriages are often a very emotional time for prospective mothers and fathers. This is not you or your partners fault. It did not occur because of an inadequacy in you or your partner. Nearly all miscarriages occur because the pregnancy has started off wrongly. At least half of these pregnancies have a chromosomal abnormality. It is almost always not inherited. Others may have developmental problems with the fetus or placenta. This does not always show up even when the products miscarried are studied under the microscope. The miscarriage is nearly always not your fault and it is not likely that you could have prevented it from happening. If you are having emotional and grieving problems, talk to your health care provider and even seek counseling, if necessary, before getting pregnant again. You can begin trying for another pregnancy as soon as your caregiver says it is OK.  HOME CARE INSTRUCTIONS   · Your caregiver may order   bed rest depending on how much bleeding and cramping you are having. You may be limited to only getting up to go to the bathroom. You may be allowed to continue light activity. You may need to make arrangements for the care of your other children and for any other responsibilities.  · Keep track of the number of pads you use each day, how often you have to change pads and how  saturated (soaked) they are. Record this information.  · DO NOT USE TAMPONS. Do not douche, have sexual intercourse or orgasms until approved by your caregiver.  · You may receive a follow up appointment for re-evaluation of your pregnancy and a repeat blood test. Re-evaluation often occurs after 2 days and again in 4 to 6 weeks. It is very important that you follow-up in the recommended time period.  · If you are Rh negative and the father is Rh positive or you do not know the fathers' blood type, you may receive a shot (Rh immune globulin) to help prevent abnormal antibodies that can develop and affect the baby in any future pregnancies.  SEEK IMMEDIATE MEDICAL CARE IF:  · You have severe cramps in your stomach, back, or abdomen.  · You have a sudden onset of severe pain in the lower part of your abdomen.  · You develop chills.  · You run an unexplained temperature of 101° F (38.3° C) or higher.  · You pass large clots or tissue. Save any tissue for your caregiver to inspect.  · Your bleeding increases or you become light-headed, weak, or have fainting episodes.  · You have a gush of fluid from your vagina.  · You pass out. This could mean you have a tubal (ectopic) pregnancy.  Document Released: 03/24/2005 Document Revised: 06/16/2011 Document Reviewed: 11/08/2007  ExitCare® Patient Information ©2014 [employer], LLC.
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
## 2013-08-16 NOTE — MAU Note (Signed)
Pt states she has been having back for about 1 wk. Pt starting having abdominal pain and back pain became worse today.

## 2013-08-16 NOTE — MAU Note (Signed)
Patient states she had an appointment with the clinic this am for her ultrasound and she missed it. Wants to have the ultrasound. States she had spotting this am and again this afternoon with wiping. Having lower abdominal and low back pain for the past 2 hours.

## 2013-08-17 LAB — GC/CHLAMYDIA PROBE AMP
CT Probe RNA: NEGATIVE
GC Probe RNA: NEGATIVE

## 2013-08-18 NOTE — MAU Provider Note (Signed)
Attestation of Attending Supervision of Obstetric Fellow: Evaluation and management procedures were performed by the Obstetric Fellow under my supervision and collaboration.  I have reviewed the Obstetric Fellow's note and chart, and I agree with the management and plan.  Taron Conrey, MD, FACOG Attending Obstetrician & Gynecologist Faculty Practice, [facility]   

## 2013-09-02 ENCOUNTER — Encounter: Payer: Self-pay | Admitting: General Practice

## 2013-09-28 ENCOUNTER — Ambulatory Visit: Payer: Medicaid Other | Admitting: Advanced Practice Midwife

## 2013-09-28 ENCOUNTER — Telehealth: Payer: Self-pay | Admitting: *Deleted

## 2013-09-28 ENCOUNTER — Encounter: Payer: Self-pay | Admitting: *Deleted

## 2013-09-28 NOTE — Telephone Encounter (Signed)
Called patient to discuss missed appointment today. Patient number is not working. Letter sent to patient.

## 2013-11-25 ENCOUNTER — Encounter: Payer: Self-pay | Admitting: General Practice

## 2014-02-06 ENCOUNTER — Encounter (HOSPITAL_COMMUNITY): Payer: Self-pay

## 2014-06-21 ENCOUNTER — Encounter (HOSPITAL_COMMUNITY): Payer: Self-pay | Admitting: *Deleted

## 2014-07-31 ENCOUNTER — Encounter (HOSPITAL_COMMUNITY): Payer: Self-pay

## 2014-07-31 ENCOUNTER — Inpatient Hospital Stay (HOSPITAL_COMMUNITY)
Admission: AD | Admit: 2014-07-31 | Discharge: 2014-08-01 | Disposition: A | Discharge status: Home / Self Care | Payer: Medicaid Other | Source: Ambulatory Visit | Attending: Obstetrics & Gynecology | Admitting: Obstetrics & Gynecology

## 2014-07-31 DIAGNOSIS — O368130 Decreased fetal movements, third trimester, not applicable or unspecified: Secondary | ICD-10-CM | POA: Diagnosis not present

## 2014-07-31 DIAGNOSIS — R51 Headache: Secondary | ICD-10-CM | POA: Diagnosis present

## 2014-07-31 DIAGNOSIS — Z87891 Personal history of nicotine dependence: Secondary | ICD-10-CM | POA: Insufficient documentation

## 2014-07-31 DIAGNOSIS — Z86718 Personal history of other venous thrombosis and embolism: Secondary | ICD-10-CM | POA: Diagnosis not present

## 2014-07-31 DIAGNOSIS — O9989 Other specified diseases and conditions complicating pregnancy, childbirth and the puerperium: Secondary | ICD-10-CM | POA: Diagnosis not present

## 2014-07-31 DIAGNOSIS — G43909 Migraine, unspecified, not intractable, without status migrainosus: Secondary | ICD-10-CM | POA: Insufficient documentation

## 2014-07-31 DIAGNOSIS — Z3A28 28 weeks gestation of pregnancy: Secondary | ICD-10-CM | POA: Insufficient documentation

## 2014-07-31 DIAGNOSIS — G43001 Migraine without aura, not intractable, with status migrainosus: Secondary | ICD-10-CM

## 2014-07-31 LAB — URINALYSIS, ROUTINE W REFLEX MICROSCOPIC
BILIRUBIN URINE: NEGATIVE
GLUCOSE, UA: NEGATIVE mg/dL
Hgb urine dipstick: NEGATIVE
KETONES UR: NEGATIVE mg/dL
Leukocytes, UA: NEGATIVE
Nitrite: NEGATIVE
PH: 6.5 (ref 5.0–8.0)
Protein, ur: NEGATIVE mg/dL
Specific Gravity, Urine: 1.015 (ref 1.005–1.030)
Urobilinogen, UA: 0.2 mg/dL (ref 0.0–1.0)

## 2014-07-31 NOTE — MAU Note (Signed)
Taking prescribed meds for migraines, not helping. Headache started this morning. Abdominal pain since yesterday. Denies vaginal bleeding or LOF. Some clear mucous discharge. Decreased fetal movement today.  [facility] [place]

## 2014-08-01 MED ORDER — METOCLOPRAMIDE HCL 5 MG/ML IJ SOLN
10.0000 mg | Freq: Once | INTRAMUSCULAR | Status: AC
  Administered 2014-08-01: 10 mg via INTRAVENOUS
  Filled 2014-08-01: qty 2

## 2014-08-01 MED ORDER — SODIUM CHLORIDE 0.9 % IV SOLN
Freq: Once | INTRAVENOUS | Status: AC
  Administered 2014-08-01: via INTRAVENOUS

## 2014-08-01 MED ORDER — DIPHENHYDRAMINE HCL 50 MG/ML IJ SOLN
25.0000 mg | Freq: Once | INTRAMUSCULAR | Status: AC
  Administered 2014-08-01: 25 mg via INTRAVENOUS
  Filled 2014-08-01: qty 1

## 2014-08-01 MED ORDER — DEXAMETHASONE SODIUM PHOSPHATE 10 MG/ML IJ SOLN
10.0000 mg | Freq: Once | INTRAMUSCULAR | Status: AC
  Administered 2014-08-01: 10 mg via INTRAVENOUS
  Filled 2014-08-01: qty 1
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
## 2014-08-01 NOTE — Discharge Instructions (Signed)
Third Trimester of Pregnancy °The third trimester is from week 29 through week 42, months 7 through 9. The third trimester is a time when the fetus is growing rapidly. At the end of the ninth month, the fetus is about 20 inches in length and weighs 6-10 pounds.  °BODY CHANGES °Your body goes through many changes during pregnancy. The changes vary from woman to woman.  °· Your weight will continue to increase. You can expect to gain 25-35 pounds (11-16 kg) by the end of the pregnancy. °· You may begin to get stretch marks on your hips, abdomen, and breasts. °· You may urinate more often because the fetus is moving lower into your pelvis and pressing on your bladder. °· You may develop or continue to have heartburn as a result of your pregnancy. °· You may develop constipation because certain hormones are causing the muscles that push waste through your intestines to slow down. °· You may develop hemorrhoids or swollen, bulging veins (varicose veins). °· You may have pelvic pain because of the weight gain and pregnancy hormones relaxing your joints between the bones in your pelvis. Backaches may result from overexertion of the muscles supporting your posture. °· You may have changes in your hair. These can include thickening of your hair, rapid growth, and changes in texture. Some women also have hair loss during or after pregnancy, or hair that feels dry or thin. Your hair will most likely return to normal after your baby is born. °· Your breasts will continue to grow and be tender. A yellow discharge may leak from your breasts called colostrum. °· Your belly button may stick out. °· You may feel short of breath because of your expanding uterus. °· You may notice the fetus "dropping," or moving lower in your abdomen. °· You may have a bloody mucus discharge. This usually occurs a few days to a week before labor begins. °· Your cervix becomes thin and soft (effaced) near your due date. °WHAT TO EXPECT AT YOUR PRENATAL  EXAMS  °You will have prenatal exams every 2 weeks until week 36. Then, you will have weekly prenatal exams. During a routine prenatal visit: °· You will be weighed to make sure you and the fetus are growing normally. °· Your blood pressure is taken. °· Your abdomen will be measured to track your baby's growth. °· The fetal heartbeat will be listened to. °· Any test results from the previous visit will be discussed. °· You may have a cervical check near your due date to see if you have effaced. °At around 36 weeks, your caregiver will check your cervix. At the same time, your caregiver will also perform a test on the secretions of the vaginal tissue. This test is to determine if a type of bacteria, Group B streptococcus, is present. Your caregiver will explain this further. °Your caregiver may ask you: °· What your birth plan is. °· How you are feeling. °· If you are feeling the baby move. °· If you have had any abnormal symptoms, such as leaking fluid, bleeding, severe headaches, or abdominal cramping. °· If you have any questions. °Other tests or screenings that may be performed during your third trimester include: °· Blood tests that check for low iron levels (anemia). °· Fetal testing to check the health, activity level, and growth of the fetus. Testing is done if you have certain medical conditions or if there are problems during the pregnancy. °FALSE LABOR °You may feel small, irregular contractions that   eventually go away. These are called Braxton Hicks contractions, or false labor. Contractions may last for hours, days, or even weeks before true labor sets in. If contractions come at regular intervals, intensify, or become painful, it is best to be seen by your caregiver.  °SIGNS OF LABOR  °· Menstrual-like cramps. °· Contractions that are 5 minutes apart or less. °· Contractions that start on the top of the uterus and spread down to the lower abdomen and back. °· A sense of increased pelvic pressure or back  pain. °· A watery or bloody mucus discharge that comes from the vagina. °If you have any of these signs before the 37th week of pregnancy, call your caregiver right away. You need to go to the hospital to get checked immediately. °HOME CARE INSTRUCTIONS  °· Avoid all smoking, herbs, alcohol, and unprescribed drugs. These chemicals affect the formation and growth of the baby. °· Follow your caregiver's instructions regarding medicine use. There are medicines that are either safe or unsafe to take during pregnancy. °· Exercise only as directed by your caregiver. Experiencing uterine cramps is a good sign to stop exercising. °· Continue to eat regular, healthy meals. °· Wear a good support bra for breast tenderness. °· Do not use hot tubs, steam rooms, or saunas. °· Wear your seat belt at all times when driving. °· Avoid raw meat, uncooked cheese, cat litter boxes, and soil used by cats. These carry germs that can cause birth defects in the baby. °· Take your prenatal vitamins. °· Try taking a stool softener (if your caregiver approves) if you develop constipation. Eat more high-fiber foods, such as fresh vegetables or fruit and whole grains. Drink plenty of fluids to keep your urine clear or pale yellow. °· Take warm sitz baths to soothe any pain or discomfort caused by hemorrhoids. Use hemorrhoid cream if your caregiver approves. °· If you develop varicose veins, wear support hose. Elevate your feet for 15 minutes, 3-4 times a day. Limit salt in your diet. °· Avoid heavy lifting, wear low heal shoes, and practice good posture. °· Rest a lot with your legs elevated if you have leg cramps or low back pain. °· Visit your dentist if you have not gone during your pregnancy. Use a soft toothbrush to brush your teeth and be gentle when you floss. °· A sexual relationship may be continued unless your caregiver directs you otherwise. °· Do not travel far distances unless it is absolutely necessary and only with the approval  of your caregiver. °· Take prenatal classes to understand, practice, and ask questions about the labor and delivery. °· Make a trial run to the hospital. °· Pack your hospital bag. °· Prepare the baby's nursery. °· Continue to go to all your prenatal visits as directed by your caregiver. °SEEK MEDICAL CARE IF: °· You are unsure if you are in labor or if your water has broken. °· You have dizziness. °· You have mild pelvic cramps, pelvic pressure, or nagging pain in your abdominal area. °· You have persistent nausea, vomiting, or diarrhea. °· You have a bad smelling vaginal discharge. °· You have pain with urination. °SEEK IMMEDIATE MEDICAL CARE IF:  °· You have a fever. °· You are leaking fluid from your vagina. °· You have spotting or bleeding from your vagina. °· You have severe abdominal cramping or pain. °· You have rapid weight loss or gain. °· You have shortness of breath with chest pain. °· You notice sudden or extreme swelling   of your face, hands, ankles, feet, or legs.  You have not felt your baby move in over an hour.  You have severe headaches that do not go away with medicine.  You have vision changes. Document Released: 03/18/2001 Document Revised: 03/29/2013 Document Reviewed: 05/25/2012 [facility] Patient Information 2015 [place], [place]. This information is not intended to replace advice given to you by your health care provider. Make sure you discuss any questions you have with your health care provider. Migraine Headache A migraine headache is an intense, throbbing pain on one or both sides of your head. A migraine can last for 30 minutes to several hours. CAUSES  The exact cause of a migraine headache is not always known. However, a migraine may be caused when nerves in the brain become irritated and release chemicals that cause inflammation. This causes pain. Certain things may also trigger migraines, such as:  Alcohol.  Smoking.  Stress.  Menstruation.  Aged  cheeses.  Foods or drinks that contain nitrates, glutamate, aspartame, or tyramine.  Lack of sleep.  Chocolate.  Caffeine.  Hunger.  Physical exertion.  Fatigue.  Medicines used to treat chest pain (nitroglycerine), birth control pills, estrogen, and some blood pressure medicines. SIGNS AND SYMPTOMS  Pain on one or both sides of your head.  Pulsating or throbbing pain.  Severe pain that prevents daily activities.  Pain that is aggravated by any physical activity.  Nausea, vomiting, or both.  Dizziness.  Pain with exposure to bright lights, loud noises, or activity.  General sensitivity to bright lights, loud noises, or smells. Before you get a migraine, you may get warning signs that a migraine is coming (aura). An aura may include:  Seeing flashing lights.  Seeing bright spots, halos, or zigzag lines.  Having tunnel vision or blurred vision.  Having feelings of numbness or tingling.  Having trouble talking.  Having muscle weakness. DIAGNOSIS  A migraine headache is often diagnosed based on:  Symptoms.  Physical exam.  A CT scan or MRI of your head. These imaging tests cannot diagnose migraines, but they can help rule out other causes of headaches. TREATMENT Medicines may be given for pain and nausea. Medicines can also be given to help prevent recurrent migraines.  HOME CARE INSTRUCTIONS  Only take over-the-counter or prescription medicines for pain or discomfort as directed by your health care provider. The use of long-term narcotics is not recommended.  Lie down in a dark, quiet room when you have a migraine.  Keep a journal to find out what may trigger your migraine headaches. For example, write down:  What you eat and drink.  How much sleep you get.  Any change to your diet or medicines.  Limit alcohol consumption.  Quit smoking if you smoke.  Get 7-9 hours of sleep, or as recommended by your health care provider.  Limit  stress.  Keep lights dim if bright lights bother you and make your migraines worse. SEEK IMMEDIATE MEDICAL CARE IF:   Your migraine becomes severe.  You have a fever.  You have a stiff neck.  You have vision loss.  You have muscular weakness or loss of muscle control.  You start losing your balance or have trouble walking.  You feel faint or pass out.  You have severe symptoms that are different from your first symptoms. MAKE SURE YOU:   Understand these instructions.  Will watch your condition.  Will get help right away if you are not doing well or get worse. Document Released:  03/24/2005 Document Revised: 08/08/2013 Document Reviewed: 11/29/2012 [facility] Patient Information 2015 [place], [place]. This information is not intended to replace advice given to you by your health care provider. Make sure you discuss any questions you have with your health care provider.

## 2014-10-14 ENCOUNTER — Encounter (HOSPITAL_COMMUNITY): Payer: Self-pay | Admitting: *Deleted

## 2014-10-14 ENCOUNTER — Inpatient Hospital Stay (HOSPITAL_COMMUNITY)
Admission: AD | Admit: 2014-10-14 | Discharge: 2014-10-14 | Disposition: A | Discharge status: Home / Self Care | Payer: Medicaid Other | Source: Ambulatory Visit | Attending: Obstetrics and Gynecology | Admitting: Obstetrics and Gynecology

## 2014-10-14 DIAGNOSIS — Z3493 Encounter for supervision of normal pregnancy, unspecified, third trimester: Secondary | ICD-10-CM | POA: Insufficient documentation

## 2014-10-14 DIAGNOSIS — Z3A38 38 weeks gestation of pregnancy: Secondary | ICD-10-CM | POA: Insufficient documentation

## 2014-10-14 LAB — POCT FERN TEST: POCT Fern Test: NEGATIVE

## 2014-10-14 NOTE — Discharge Instructions (Signed)

## 2014-10-14 NOTE — MAU Note (Signed)
Pt states here for contractions q8-10  Minutes apart. Also felt gush of fluid after getting out of pool yesterday afternoon, and has continued to leak fluid.

## 2015-06-05 ENCOUNTER — Encounter (HOSPITAL_COMMUNITY): Payer: Self-pay | Admitting: *Deleted

## 2016-07-04 DIAGNOSIS — K133 Hairy leukoplakia: Secondary | ICD-10-CM | POA: Diagnosis not present

## 2016-07-04 DIAGNOSIS — J00 Acute nasopharyngitis [common cold]: Secondary | ICD-10-CM | POA: Diagnosis not present

## 2016-07-26 ENCOUNTER — Encounter (HOSPITAL_COMMUNITY): Payer: Self-pay | Admitting: Emergency Medicine

## 2016-07-26 ENCOUNTER — Ambulatory Visit (HOSPITAL_COMMUNITY)
Admission: EM | Admit: 2016-07-26 | Discharge: 2016-07-26 | Disposition: A | Discharge status: Home / Self Care | Payer: Medicaid Other | Attending: Family Medicine | Admitting: Family Medicine

## 2016-07-26 DIAGNOSIS — G43A0 Cyclical vomiting, not intractable: Secondary | ICD-10-CM

## 2016-07-26 DIAGNOSIS — G43001 Migraine without aura, not intractable, with status migrainosus: Secondary | ICD-10-CM

## 2016-07-26 DIAGNOSIS — R1115 Cyclical vomiting syndrome unrelated to migraine: Secondary | ICD-10-CM

## 2016-07-26 MED ORDER — ONDANSETRON 4 MG PO TBDP
ORAL_TABLET | ORAL | Status: AC
  Filled 2016-07-26: qty 1

## 2016-07-26 MED ORDER — ONDANSETRON 4 MG PO TBDP
4.0000 mg | ORAL_TABLET | Freq: Once | ORAL | Status: AC
  Administered 2016-07-26: 4 mg via ORAL

## 2016-07-26 MED ORDER — ONDANSETRON 8 MG PO TBDP: 8.0000 mg | ORAL_TABLET | Freq: Three times a day (TID) | ORAL | 0 refills | Status: AC | PRN
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
## 2016-07-26 NOTE — ED Triage Notes (Signed)
Pt reports nausea yesterday.  Today she woke up vomiting.  She has had approximately 6 episodes of vomiting today.  Not able to keep liquids or solids down.  Pt reports starting two jobs one week ago.  She works 14 hours a day and typically does not eat all day long.

## 2016-07-26 NOTE — Discharge Instructions (Signed)
Go home and take tomorrow off.  Explain your situation to your employers and see if they will reduce your hours so that you are in [place] no more than 10 hours a day.

## 2018-04-21 DIAGNOSIS — H9202 Otalgia, left ear: Secondary | ICD-10-CM | POA: Diagnosis not present

## 2018-04-21 DIAGNOSIS — R05 Cough: Secondary | ICD-10-CM | POA: Diagnosis not present

## 2018-04-21 DIAGNOSIS — R1084 Generalized abdominal pain: Secondary | ICD-10-CM | POA: Diagnosis not present

## 2018-04-21 DIAGNOSIS — Z79899 Other long term (current) drug therapy: Secondary | ICD-10-CM | POA: Diagnosis not present

## 2018-04-21 DIAGNOSIS — R0981 Nasal congestion: Secondary | ICD-10-CM | POA: Diagnosis not present

## 2018-04-21 DIAGNOSIS — N6311 Unspecified lump in the right breast, upper outer quadrant: Secondary | ICD-10-CM | POA: Diagnosis not present

## 2018-04-21 DIAGNOSIS — M791 Myalgia, unspecified site: Secondary | ICD-10-CM | POA: Diagnosis not present

## 2018-04-21 DIAGNOSIS — R42 Dizziness and giddiness: Secondary | ICD-10-CM | POA: Diagnosis not present

## 2018-04-21 DIAGNOSIS — R112 Nausea with vomiting, unspecified: Secondary | ICD-10-CM | POA: Diagnosis not present

## 2018-04-21 DIAGNOSIS — R51 Headache: Secondary | ICD-10-CM | POA: Diagnosis not present

## 2018-04-21 DIAGNOSIS — J029 Acute pharyngitis, unspecified: Secondary | ICD-10-CM | POA: Diagnosis not present

## 2018-04-21 DIAGNOSIS — R509 Fever, unspecified: Secondary | ICD-10-CM | POA: Diagnosis not present

## 2018-04-21 DIAGNOSIS — R197 Diarrhea, unspecified: Secondary | ICD-10-CM | POA: Diagnosis not present

## 2018-05-31 DIAGNOSIS — M7541 Impingement syndrome of right shoulder: Secondary | ICD-10-CM | POA: Diagnosis not present

## 2018-06-18 ENCOUNTER — Encounter (HOSPITAL_COMMUNITY): Payer: Self-pay

## 2018-06-18 ENCOUNTER — Other Ambulatory Visit: Payer: Self-pay

## 2018-06-18 ENCOUNTER — Ambulatory Visit (HOSPITAL_COMMUNITY)
Admission: EM | Admit: 2018-06-18 | Discharge: 2018-06-18 | Disposition: A | Discharge status: Home / Self Care | Payer: Medicaid Other | Attending: Emergency Medicine | Admitting: Emergency Medicine

## 2018-06-18 DIAGNOSIS — B9789 Other viral agents as the cause of diseases classified elsewhere: Secondary | ICD-10-CM | POA: Diagnosis not present

## 2018-06-18 DIAGNOSIS — J069 Acute upper respiratory infection, unspecified: Secondary | ICD-10-CM | POA: Diagnosis not present

## 2018-06-18 LAB — RESPIRATORY PANEL BY PCR
Adenovirus: NOT DETECTED
Bordetella pertussis: NOT DETECTED
CORONAVIRUS 229E-RVPPCR: NOT DETECTED
Chlamydophila pneumoniae: NOT DETECTED
Coronavirus HKU1: NOT DETECTED
Coronavirus NL63: NOT DETECTED
Coronavirus OC43: NOT DETECTED
INFLUENZA B-RVPPCR: NOT DETECTED
Influenza A: NOT DETECTED
Metapneumovirus: NOT DETECTED
Mycoplasma pneumoniae: NOT DETECTED
Parainfluenza Virus 1: NOT DETECTED
Parainfluenza Virus 2: NOT DETECTED
Parainfluenza Virus 3: NOT DETECTED
Parainfluenza Virus 4: NOT DETECTED
RESPIRATORY SYNCYTIAL VIRUS-RVPPCR: NOT DETECTED
Rhinovirus / Enterovirus: DETECTED — AB

## 2018-06-18 LAB — POCT INFLUENZA A/B
INFLUENZA A, POC: NEGATIVE
INFLUENZA B, POC: NEGATIVE

## 2018-06-18 MED ORDER — FLUTICASONE PROPIONATE 50 MCG/ACT NA SUSP: 1.0000 | Freq: Every day | NASAL | 2 refills | Status: AC

## 2018-06-18 MED ORDER — ALBUTEROL SULFATE HFA 108 (90 BASE) MCG/ACT IN AERS
1.0000 | INHALATION_SPRAY | Freq: Four times a day (QID) | RESPIRATORY_TRACT | 0 refills | Status: AC | PRN
Start: 2018-06-18 — End: ?
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
## 2018-06-18 NOTE — ED Triage Notes (Signed)
Pt presents with fever, productive cough with mucus, sore throat, congestion and chest pain X 2 days.  Pt has been closely exposed to employee that has traveled back from [place] 3 days ago.

## 2018-06-18 NOTE — Discharge Instructions (Signed)
At this time without known exposure to confirmed Covid-19 we are not testing you.  You are at low risk for this.  This is likely a viral upper respiratory tract infection and will improve in the next 5-7 days.  Push fluids to ensure adequate hydration and keep secretions thin.  Tylenol and/or ibuprofen as needed for pain or fevers.   Use of inhaler as needed for wheezing or shortness of breath .  Decrease to quit smoking as this can worsen your symptoms.  Do not return to work until fever free for 24 hours without treatment.  We are running a viral respiratory panel and will call with any positive results.  Continue to use precaution to prevent spread of illness, wash hands, cover cough and avoid sharing utensils/beverages etc.  If symptoms worsen or do not improve in the next week to return to be seen or to follow up with your PCP.

## 2018-06-21 ENCOUNTER — Telehealth (HOSPITAL_COMMUNITY): Payer: Self-pay | Admitting: Emergency Medicine

## 2018-06-21 NOTE — Telephone Encounter (Signed)
Patient contacted and made aware of all results, all questions answered.   

## 2020-08-20 ENCOUNTER — Other Ambulatory Visit: Payer: Self-pay

## 2020-08-20 ENCOUNTER — Ambulatory Visit
Admission: EM | Admit: 2020-08-20 | Discharge: 2020-08-20 | Disposition: A | Discharge status: Home / Self Care | Payer: Medicaid Other | Attending: Emergency Medicine | Admitting: Emergency Medicine

## 2020-08-20 DIAGNOSIS — J069 Acute upper respiratory infection, unspecified: Secondary | ICD-10-CM

## 2020-08-20 DIAGNOSIS — N730 Acute parametritis and pelvic cellulitis: Secondary | ICD-10-CM

## 2020-08-20 LAB — POCT URINALYSIS DIP (MANUAL ENTRY)
Bilirubin, UA: NEGATIVE
Blood, UA: NEGATIVE
Glucose, UA: NEGATIVE mg/dL
Ketones, POC UA: NEGATIVE mg/dL
Leukocytes, UA: NEGATIVE
Nitrite, UA: NEGATIVE
Protein Ur, POC: NEGATIVE mg/dL
Spec Grav, UA: 1.015 (ref 1.010–1.025)
Urobilinogen, UA: 0.2 E.U./dL
pH, UA: 7.5 (ref 5.0–8.0)

## 2020-08-20 LAB — POCT URINE PREGNANCY: Preg Test, Ur: NEGATIVE

## 2020-08-20 MED ORDER — DOXYCYCLINE HYCLATE 100 MG PO CAPS: 100.0000 mg | ORAL_CAPSULE | Freq: Two times a day (BID) | ORAL | 0 refills | Status: AC

## 2020-08-20 MED ORDER — CEFTRIAXONE SODIUM 500 MG IJ SOLR
500.0000 mg | Freq: Once | INTRAMUSCULAR | Status: AC
  Administered 2020-08-20: 500 mg via INTRAMUSCULAR

## 2020-08-20 NOTE — Discharge Instructions (Signed)
Complete course of antibiotics.  Please withhold from intercourse for the next week. We will notify of you any positive findings or if any changes to treatment are needed. If normal or otherwise without concern to your results, we will not call you. Please log on to your MyChart to review your results if interested in so.   Follow up with gynecology for further evaluation and management of your IUD

## 2020-08-20 NOTE — ED Triage Notes (Signed)
Pt presents with c/o cough and lower abdominal pain for past week, pt states she has h/o PID
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
## 2020-08-21 ENCOUNTER — Telehealth: Payer: Self-pay | Admitting: Emergency Medicine

## 2020-08-21 NOTE — Telephone Encounter (Signed)
Attempted to contact patient to come in for re collection of std swab, no answer at number in chart.  Left VM for pt to return call.

## 2020-08-22 ENCOUNTER — Ambulatory Visit
Admission: EM | Admit: 2020-08-22 | Discharge: 2020-08-22 | Disposition: A | Discharge status: Home / Self Care | Payer: Medicaid Other | Attending: Family Medicine | Admitting: Family Medicine

## 2020-08-22 ENCOUNTER — Other Ambulatory Visit: Payer: Self-pay

## 2020-08-22 ENCOUNTER — Encounter: Payer: Self-pay | Admitting: Emergency Medicine

## 2020-08-22 DIAGNOSIS — J069 Acute upper respiratory infection, unspecified: Secondary | ICD-10-CM | POA: Diagnosis not present

## 2020-08-22 DIAGNOSIS — A084 Viral intestinal infection, unspecified: Secondary | ICD-10-CM

## 2020-08-22 DIAGNOSIS — G43809 Other migraine, not intractable, without status migrainosus: Secondary | ICD-10-CM

## 2020-08-22 DIAGNOSIS — Z1152 Encounter for screening for COVID-19: Secondary | ICD-10-CM

## 2020-08-22 MED ORDER — DEXAMETHASONE SODIUM PHOSPHATE 10 MG/ML IJ SOLN
10.0000 mg | Freq: Once | INTRAMUSCULAR | Status: AC
  Administered 2020-08-22: 10 mg via INTRAMUSCULAR

## 2020-08-22 MED ORDER — KETOROLAC TROMETHAMINE 30 MG/ML IJ SOLN
30.0000 mg | Freq: Once | INTRAMUSCULAR | Status: AC
  Administered 2020-08-22: 30 mg via INTRAMUSCULAR

## 2020-08-22 MED ORDER — ONDANSETRON 4 MG PO TBDP
4.0000 mg | ORAL_TABLET | Freq: Once | ORAL | Status: AC
  Administered 2020-08-22: 4 mg via ORAL

## 2020-08-22 MED ORDER — PROMETHAZINE HCL 25 MG PO TABS: 25.0000 mg | ORAL_TABLET | Freq: Four times a day (QID) | ORAL | 0 refills | Status: AC | PRN

## 2020-08-22 NOTE — ED Triage Notes (Signed)
Vomiting and diarrhea x 2 days ago. Needs covid test and note for work.

## 2020-08-22 NOTE — Discharge Instructions (Addendum)
You received Zofran in office today for nausea and vomiting  You have received Decadron and Toradol in combination for your migraine  I have sent in Phenergan to the pharmacy for you to take 1 tablet every 6 hours as needed for nausea and vomiting  Your COVID and Influenza tests are pending.  You should self quarantine until the test results are back.    Take Tylenol or ibuprofen as needed for fever or discomfort.  Rest and keep yourself hydrated.    Follow-up with your primary care provider if your symptoms are not improving.
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
## 2020-08-24 LAB — NOVEL CORONAVIRUS, NAA: SARS-CoV-2, NAA: NOT DETECTED

## 2020-08-24 LAB — SARS-COV-2, NAA 2 DAY TAT

## 2020-08-25 ENCOUNTER — Telehealth: Payer: Self-pay

## 2020-08-25 ENCOUNTER — Other Ambulatory Visit (HOSPITAL_COMMUNITY)
Admission: RE | Admit: 2020-08-25 | Discharge: 2020-08-25 | Disposition: A | Discharge status: Home / Self Care | Payer: Medicaid Other | Source: Ambulatory Visit | Attending: Family Medicine | Admitting: Family Medicine

## 2020-08-25 DIAGNOSIS — J069 Acute upper respiratory infection, unspecified: Secondary | ICD-10-CM | POA: Insufficient documentation

## 2020-08-25 DIAGNOSIS — N730 Acute parametritis and pelvic cellulitis: Secondary | ICD-10-CM | POA: Diagnosis not present

## 2020-08-27 LAB — CERVICOVAGINAL ANCILLARY ONLY
Bacterial Vaginitis (gardnerella): NEGATIVE
Candida Glabrata: NEGATIVE
Candida Vaginitis: NEGATIVE
Chlamydia: NEGATIVE
Comment: NEGATIVE
Comment: NEGATIVE
Comment: NEGATIVE
Comment: NEGATIVE
Comment: NEGATIVE
Comment: NORMAL
Neisseria Gonorrhea: NEGATIVE
Trichomonas: NEGATIVE

## 2022-12-09 DIAGNOSIS — J069 Acute upper respiratory infection, unspecified: Secondary | ICD-10-CM | POA: Diagnosis not present

## 2022-12-09 DIAGNOSIS — R42 Dizziness and giddiness: Secondary | ICD-10-CM | POA: Diagnosis not present

## 2022-12-09 DIAGNOSIS — R110 Nausea: Secondary | ICD-10-CM | POA: Diagnosis not present

## 2023-12-10 DIAGNOSIS — N10 Acute tubulo-interstitial nephritis: Secondary | ICD-10-CM | POA: Diagnosis not present

## 2023-12-10 DIAGNOSIS — A4151 Sepsis due to Escherichia coli [E. coli]: Secondary | ICD-10-CM | POA: Diagnosis not present

## 2023-12-10 DIAGNOSIS — E669 Obesity, unspecified: Secondary | ICD-10-CM | POA: Diagnosis not present

## 2023-12-10 DIAGNOSIS — Z6834 Body mass index (BMI) 34.0-34.9, adult: Secondary | ICD-10-CM | POA: Diagnosis not present

## 2023-12-10 DIAGNOSIS — Z87891 Personal history of nicotine dependence: Secondary | ICD-10-CM | POA: Diagnosis not present

## 2023-12-10 DIAGNOSIS — B9620 Unspecified Escherichia coli [E. coli] as the cause of diseases classified elsewhere: Secondary | ICD-10-CM | POA: Diagnosis not present

## 2023-12-10 DIAGNOSIS — Z1152 Encounter for screening for COVID-19: Secondary | ICD-10-CM | POA: Diagnosis not present

## 2023-12-13 DIAGNOSIS — N200 Calculus of kidney: Secondary | ICD-10-CM | POA: Diagnosis not present
